# Patient Record
Sex: Male | Born: 1955
Health system: Southern US, Community
[De-identification: ages and names within clinical notes are randomized; demographics above are authoritative.]

## PROBLEM LIST (undated history)

## (undated) DIAGNOSIS — A770 Spotted fever due to Rickettsia rickettsii: Secondary | ICD-10-CM

## (undated) DIAGNOSIS — M51369 Other intervertebral disc degeneration, lumbar region without mention of lumbar back pain or lower extremity pain: Secondary | ICD-10-CM

## (undated) DIAGNOSIS — E7800 Pure hypercholesterolemia, unspecified: Secondary | ICD-10-CM

## (undated) DIAGNOSIS — M5136 Other intervertebral disc degeneration, lumbar region: Secondary | ICD-10-CM

## (undated) DIAGNOSIS — IMO0001 Reserved for inherently not codable concepts without codable children: Secondary | ICD-10-CM

## (undated) DIAGNOSIS — M5030 Other cervical disc degeneration, unspecified cervical region: Secondary | ICD-10-CM

## (undated) DIAGNOSIS — E1122 Type 2 diabetes mellitus with diabetic chronic kidney disease: Secondary | ICD-10-CM

## (undated) HISTORY — PX: COLONOSCOPY: SHX174

## (undated) HISTORY — PX: CATARACT EXTRACTION: SUR2

## (undated) HISTORY — PX: VASECTOMY: SHX75

---
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
## 2001-04-02 ENCOUNTER — Ambulatory Visit (HOSPITAL_COMMUNITY): Admission: RE | Admit: 2001-04-02 | Discharge: 2001-04-02 | Payer: Self-pay | Admitting: Pulmonary Disease

## 2002-10-30 ENCOUNTER — Ambulatory Visit (HOSPITAL_COMMUNITY): Admission: RE | Admit: 2002-10-30 | Discharge: 2002-10-30 | Payer: Self-pay | Admitting: Pulmonary Disease

## 2003-09-15 ENCOUNTER — Ambulatory Visit (HOSPITAL_COMMUNITY): Admission: RE | Admit: 2003-09-15 | Discharge: 2003-09-15 | Payer: Self-pay | Admitting: General Surgery

## 2003-12-26 DIAGNOSIS — IMO0001 Reserved for inherently not codable concepts without codable children: Secondary | ICD-10-CM

## 2003-12-26 HISTORY — DX: Reserved for inherently not codable concepts without codable children: IMO0001

## 2004-04-12 ENCOUNTER — Ambulatory Visit (HOSPITAL_COMMUNITY): Admission: RE | Admit: 2004-04-12 | Discharge: 2004-04-12 | Payer: Self-pay | Admitting: Pulmonary Disease

## 2007-08-06 ENCOUNTER — Ambulatory Visit (HOSPITAL_COMMUNITY): Admission: RE | Admit: 2007-08-06 | Discharge: 2007-08-06 | Payer: Self-pay | Admitting: Pulmonary Disease

## 2011-05-12 NOTE — Procedures (Signed)
NAME:  Andres Robinson, Andres Robinson                          ACCOUNT NO.:  [id number]   MEDICAL RECORD NO.:  [id number]                   PATIENT TYPE:  OUT   LOCATION:  [facility]                                 FACILITY:  [facility]   PHYSICIAN:  Edward L. Juanetta Gosling, M.D.             DATE OF BIRTH:  06/26/1956   DATE OF PROCEDURE:  DATE OF DISCHARGE:                                    STRESS TEST   INDICATIONS FOR PROCEDURE:  [name] has been having chest discomfort.  He  does have high cholesterol and has a family history of cardiac disease as  well.  There are no contraindications to graded exercise testing.   FINDINGS:  [name] exercised for 12 minutes on the Bruce protocol  reaching and sustaining 12.8 mets.  His maximum recorded heart rate was 154  which was 91% of his age-predicted maximal heart rate.  Blood pressure  response to exercise was normal.  He had no symptoms with exercise.  There  were no electrocardiographic changes suggestive of inducible ischemia.   IMPRESSION:  1. No evidence of inducible ischemia.  2. Normal blood pressure response to exercise.  3. Excellent exercise tolerance.  4. No symptoms with exercise.      ___________________________________________                                            Oneal Deputy. Juanetta Gosling, M.D.   ELH/MEDQ  D:  04/12/2004  T:  04/12/2004  Job:  [id number]

## 2011-05-12 NOTE — H&P (Signed)
   NAMEEWEL, LONA NO.:  [id number]   MEDICAL RECORD NO.:  [id number]                  PATIENT TYPE:   LOCATION:                                       FACILITY:  [facility]   PHYSICIAN:  Dalia Heading, M.D.               DATE OF BIRTH:  Nov 20, 1956   DATE OF ADMISSION:  09/15/2003  DATE OF DISCHARGE:                                HISTORY & PHYSICAL   CHIEF COMPLAINT:  Hematochezia.   HISTORY OF PRESENT ILLNESS:  The patient is a 55 year old white male who was  referred for endoscopic evaluation.  He started having hematochezia over the  past 2 months.  Some rectal pain has been noted.  Bright red blood per  rectum has been noted on occasion.  Last episode was earlier today.  His  last colonoscopy in 2000 was negative.  He had limited hematochezia until  recently.  No abdominal pain, nausea, vomiting, diarrhea, constipation, or  melena have been noted.  No history of hemorrhoidal disease.  No family  history of colon carcinoma.   PAST MEDICAL HISTORY:  High cholesterol levels.   PAST SURGICAL HISTORY:  As noted above.   CURRENT MEDICATIONS:  Lipitor.   ALLERGIES:  PENICILLIN.   REVIEW OF SYSTEMS:  Unremarkable.   PHYSICAL EXAMINATION:  GENERAL:  The patient is a well-developed, well-  nourished white male in no acute distress.  VITAL SIGNS:  He is afebrile and vital signs are stable.  RESPIRATORY:  Lungs clear to auscultation with equal breath sounds  bilaterally.  CARDIAC:  Heart examination reveals a regular rate and rhythm without S3,  S4, or murmurs.  ABDOMEN:  The abdomen is soft, nontender, nondistended.  No  hepatosplenomegaly or masses are noted.  RECTAL:  Rectal examination was deferred to the procedure.   IMPRESSION:  Hematochezia.    PLAN:  The patient was scheduled for colonoscopy on September 15, 2003.  The  risks and benefits of the procedure including bleeding and perforation were  fully explained to the patient who gave  informed consent.                                               Dalia Heading, M.D.    MAJ/MEDQ  D:  09/03/2003  T:  09/03/2003  Job:  [id number]   cc:   Ramon Dredge L. Juanetta Gosling, M.D.  [address]  [place]  [place] [address]  Fax: [phone]

## 2011-12-26 DIAGNOSIS — A770 Spotted fever due to Rickettsia rickettsii: Secondary | ICD-10-CM

## 2011-12-26 HISTORY — DX: Spotted fever due to Rickettsia rickettsii: A77.0

## 2013-06-04 ENCOUNTER — Emergency Department (HOSPITAL_COMMUNITY): Payer: BC Managed Care – PPO

## 2013-06-04 ENCOUNTER — Emergency Department (HOSPITAL_COMMUNITY)
Admission: EM | Admit: 2013-06-04 | Discharge: 2013-06-04 | Disposition: A | Discharge status: Home / Self Care | Payer: BC Managed Care – PPO | Attending: Emergency Medicine | Admitting: Emergency Medicine

## 2013-06-04 ENCOUNTER — Encounter (HOSPITAL_COMMUNITY): Payer: Self-pay | Admitting: Emergency Medicine

## 2013-06-04 DIAGNOSIS — J029 Acute pharyngitis, unspecified: Secondary | ICD-10-CM

## 2013-06-04 DIAGNOSIS — T148 Other injury of unspecified body region: Secondary | ICD-10-CM | POA: Insufficient documentation

## 2013-06-04 DIAGNOSIS — W57XXXA Bitten or stung by nonvenomous insect and other nonvenomous arthropods, initial encounter: Secondary | ICD-10-CM | POA: Insufficient documentation

## 2013-06-04 DIAGNOSIS — R52 Pain, unspecified: Secondary | ICD-10-CM | POA: Insufficient documentation

## 2013-06-04 DIAGNOSIS — M51379 Other intervertebral disc degeneration, lumbosacral region without mention of lumbar back pain or lower extremity pain: Secondary | ICD-10-CM | POA: Insufficient documentation

## 2013-06-04 DIAGNOSIS — Z8639 Personal history of other endocrine, nutritional and metabolic disease: Secondary | ICD-10-CM | POA: Insufficient documentation

## 2013-06-04 DIAGNOSIS — Z880 Allergy status to penicillin: Secondary | ICD-10-CM | POA: Insufficient documentation

## 2013-06-04 DIAGNOSIS — R071 Chest pain on breathing: Secondary | ICD-10-CM | POA: Insufficient documentation

## 2013-06-04 DIAGNOSIS — R5383 Other fatigue: Secondary | ICD-10-CM | POA: Insufficient documentation

## 2013-06-04 DIAGNOSIS — R51 Headache: Secondary | ICD-10-CM | POA: Insufficient documentation

## 2013-06-04 DIAGNOSIS — M5030 Other cervical disc degeneration, unspecified cervical region: Secondary | ICD-10-CM | POA: Insufficient documentation

## 2013-06-04 DIAGNOSIS — R0789 Other chest pain: Secondary | ICD-10-CM

## 2013-06-04 DIAGNOSIS — Y929 Unspecified place or not applicable: Secondary | ICD-10-CM | POA: Insufficient documentation

## 2013-06-04 DIAGNOSIS — R5381 Other malaise: Secondary | ICD-10-CM | POA: Insufficient documentation

## 2013-06-04 DIAGNOSIS — Z7982 Long term (current) use of aspirin: Secondary | ICD-10-CM | POA: Insufficient documentation

## 2013-06-04 DIAGNOSIS — Z8619 Personal history of other infectious and parasitic diseases: Secondary | ICD-10-CM | POA: Insufficient documentation

## 2013-06-04 DIAGNOSIS — Z79899 Other long term (current) drug therapy: Secondary | ICD-10-CM | POA: Insufficient documentation

## 2013-06-04 DIAGNOSIS — Z862 Personal history of diseases of the blood and blood-forming organs and certain disorders involving the immune mechanism: Secondary | ICD-10-CM | POA: Insufficient documentation

## 2013-06-04 DIAGNOSIS — M5137 Other intervertebral disc degeneration, lumbosacral region: Secondary | ICD-10-CM | POA: Insufficient documentation

## 2013-06-04 DIAGNOSIS — Y939 Activity, unspecified: Secondary | ICD-10-CM | POA: Insufficient documentation

## 2013-06-04 HISTORY — DX: Reserved for inherently not codable concepts without codable children: IMO0001

## 2013-06-04 HISTORY — DX: Other intervertebral disc degeneration, lumbar region without mention of lumbar back pain or lower extremity pain: M51.369

## 2013-06-04 HISTORY — DX: Pure hypercholesterolemia, unspecified: E78.00

## 2013-06-04 HISTORY — DX: Other intervertebral disc degeneration, lumbar region: M51.36

## 2013-06-04 HISTORY — DX: Other cervical disc degeneration, unspecified cervical region: M50.30

## 2013-06-04 HISTORY — DX: Spotted fever due to Rickettsia rickettsii: A77.0

## 2013-06-04 LAB — CBC WITH DIFFERENTIAL/PLATELET
Eosinophils Absolute: 0.2 10*3/uL (ref 0.0–0.7)
Eosinophils Relative: 2 % (ref 0–5)
Hemoglobin: 14.7 g/dL (ref 13.0–17.0)
Lymphs Abs: 1 10*3/uL (ref 0.7–4.0)
MCH: 31.3 pg (ref 26.0–34.0)
MCV: 89.1 fL (ref 78.0–100.0)
Monocytes Relative: 9 % (ref 3–12)
Neutrophils Relative %: 80 % — ABNORMAL HIGH (ref 43–77)
RBC: 4.69 MIL/uL (ref 4.22–5.81)

## 2013-06-04 LAB — BASIC METABOLIC PANEL
CO2: 27 mEq/L (ref 19–32)
Chloride: 101 mEq/L (ref 96–112)
Glucose, Bld: 128 mg/dL — ABNORMAL HIGH (ref 70–99)
Potassium: 4 mEq/L (ref 3.5–5.1)
Sodium: 140 mEq/L (ref 135–145)

## 2013-06-04 MED ORDER — SODIUM CHLORIDE 0.9 % IV SOLN: INTRAVENOUS | Status: DC

## 2013-06-04 MED ORDER — MORPHINE SULFATE 4 MG/ML IJ SOLN: 4.0000 mg | INTRAMUSCULAR | Status: DC | PRN

## 2013-06-04 MED ORDER — DOXYCYCLINE HYCLATE 100 MG PO TABS: 100.0000 mg | ORAL_TABLET | Freq: Two times a day (BID) | ORAL | Status: DC

## 2013-06-04 MED ORDER — IOHEXOL 300 MG/ML  SOLN
75.0000 mL | Freq: Once | INTRAMUSCULAR | Status: AC | PRN
  Administered 2013-06-04: 75 mL via INTRAVENOUS

## 2013-06-04 NOTE — ED Provider Notes (Signed)
History     CSN: [id number]  Arrival date & time 06/04/13  1702   First MD Initiated Contact with Patient 06/04/13 1935      Chief Complaint  Patient presents with  . Headache  . Chest Pain  . Sore Throat     HPI Pt was seen at 1955.  Per pt, c/o gradual onset and persistence of constant mid-sternal chest "pain" that began approx 1300/1330 PTA.  Pt states the discomfort has been constant since onset; described as "dull" and "tight." Pt also states he has a frontal "dull" headache located in his forehead, sore throat and generalized body aches/fatigue that began this morning.  Pt states his throat hurts "in the back" especially "when I bend over to tie my shoe or touch my throat." Describes the throat pain as "sharp."  Pt states his symptoms "remind me of when I had RMSF last year." Endorses he has pulled multiple ticks off of him over the past 1 to 2 weeks.  Denies neck pain, no fevers, no palpitations, no SOB/cough, no abd pain, no back pain, no N/V/D, no rash.  Denies headache was sudden or maximal in onset or at any time.  Denies visual changes, no focal motor weakness, no tingling/numbness in extremities.       Past Medical History  Diagnosis Date  . High cholesterol   . Normal cardiac stress test 2005  . DDD (degenerative disc disease), lumbar   . RMSF [facility] spotted fever) 2013  . DDD (degenerative disc disease), cervical     History reviewed. No pertinent past surgical history.    History  Substance Use Topics  . Smoking status: Never Smoker   . Smokeless tobacco: Not on file  . Alcohol Use: No      Review of Systems ROS: Statement: All systems negative except as marked or noted in the HPI; Constitutional: Negative for fever and chills. +generalized body aches/fatigue ; ; Eyes: Negative for eye pain, redness and discharge. ; ; ENMT: Negative for ear pain, hoarseness, nasal congestion, sinus pressure and +sore throat. ; ; Cardiovascular: +chest pain.  Negative for palpitations, diaphoresis, dyspnea and peripheral edema. ; ; Respiratory: Negative for cough, wheezing and stridor. ; ; Gastrointestinal: Negative for nausea, vomiting, diarrhea, abdominal pain, blood in stool, hematemesis, jaundice and rectal bleeding. . ; ; Genitourinary: Negative for dysuria, flank pain and hematuria. ; ; Musculoskeletal: Negative for back pain and neck pain. Negative for swelling and trauma.; ; Skin: Negative for pruritus, rash, abrasions, blisters, bruising and skin lesion.; ; Neuro: +headache. Negative for lightheadedness and neck stiffness. Negative for weakness, altered level of consciousness , altered mental status, extremity weakness, paresthesias, involuntary movement, seizure and syncope.       Allergies  Penicillins  Home Medications   Current Outpatient Rx  Name  Route  Sig  Dispense  Refill  . acetaminophen (TYLENOL) 325 MG tablet   Oral   Take 650 mg by mouth every 6 (six) hours as needed for pain.         Marland Kitchen allopurinol (ZYLOPRIM) 300 MG tablet   Oral   Take 450 mg by mouth daily.         Marland Kitchen aspirin EC 81 MG tablet   Oral   Take 162 mg by mouth daily.           BP 138/76  Pulse 74  Temp(Src) 97.9 F (36.6 C)  Resp 20  Ht 5\' 10"  (1.778 m)  Wt 245 lb (111.131  kg)  BMI 35.15 kg/m2  SpO2 100%  Physical Exam 2000: Physical examination:  Nursing notes reviewed; Vital signs and O2 SAT reviewed;  Constitutional: Well developed, Well nourished, Well hydrated, In no acute distress; Head:  Normocephalic, atraumatic; Eyes: EOMI, PERRL, No scleral icterus; ENMT: TM's clear bilat. +edemetous nasal turbinates bilat with clear rhinorrhea. Mouth and pharynx without lesions. No tonsillar exudates. No intra-oral edema. No submandibular or sublingual edema. No hoarse voice, no drooling, no stridor. No pain with manipulation of larynx. Mouth and pharynx normal, Mucous membranes moist; Neck: Supple, Full range of motion, No lymphadenopathy;  Cardiovascular: Regular rate and rhythm, No murmur, rub, or gallop; Respiratory: Breath sounds clear & equal bilaterally, No rales, rhonchi, wheezes.  Speaking full sentences with ease, Normal respiratory effort/excursion; Chest: Nontender, Movement normal; Abdomen: Soft, Nontender, Nondistended, Normal bowel sounds; Genitourinary: No CVA tenderness; Spine:  No midline CS, TS, LS tenderness.; Extremities: Pulses normal, No tenderness, No edema, No calf edema or asymmetry.; Neuro: AA&Ox3, Major CN grossly intact. No facial droop. Speech clear. No gross focal motor or sensory deficits in extremities.; Skin: Color normal, Warm, Dry.   ED Course  Procedures     MDM  MDM Reviewed: previous chart, nursing note and vitals Reviewed previous: labs Interpretation: ECG, labs, x-ray and CT scan    Date: 06/04/2013  Rate: 75  Rhythm: normal sinus rhythm  QRS Axis: left  Intervals: normal  ST/T Wave abnormalities: normal  Conduction Disutrbances:none  Narrative Interpretation:   Old EKG Reviewed: none available   Results for orders placed during the hospital encounter of 06/04/13  RAPID STREP SCREEN      Result Value Range   Streptococcus, Group A Screen (Direct) NEGATIVE  NEGATIVE  BASIC METABOLIC PANEL      Result Value Range   Sodium 140  135 - 145 mEq/L   Potassium 4.0  3.5 - 5.1 mEq/L   Chloride 101  96 - 112 mEq/L   CO2 27  19 - 32 mEq/L   Glucose, Bld 128 (*) 70 - 99 mg/dL   BUN 18  6 - 23 mg/dL   Creatinine, Ser 1.61  0.50 - 1.35 mg/dL   Calcium 9.6  8.4 - 09.6 mg/dL   GFR calc non Af Amer 75 (*) >90 mL/min   GFR calc Af Amer 87 (*) >90 mL/min  CBC WITH DIFFERENTIAL      Result Value Range   WBC 11.1 (*) 4.0 - 10.5 K/uL   RBC 4.69  4.22 - 5.81 MIL/uL   Hemoglobin 14.7  13.0 - 17.0 g/dL   HCT 04.5  40.9 - 81.1 %   MCV 89.1  78.0 - 100.0 fL   MCH 31.3  26.0 - 34.0 pg   MCHC 35.2  30.0 - 36.0 g/dL   RDW 91.4  78.2 - 95.6 %   Platelets 186  150 - 400 K/uL   Neutrophils  Relative % 80 (*) 43 - 77 %   Neutro Abs 8.9 (*) 1.7 - 7.7 K/uL   Lymphocytes Relative 9 (*) 12 - 46 %   Lymphs Abs 1.0  0.7 - 4.0 K/uL   Monocytes Relative 9  3 - 12 %   Monocytes Absolute 1.0  0.1 - 1.0 K/uL   Eosinophils Relative 2  0 - 5 %   Eosinophils Absolute 0.2  0.0 - 0.7 K/uL   Basophils Relative 1  0 - 1 %   Basophils Absolute 0.1  0.0 - 0.1 K/uL  TROPONIN I  Result Value Range   Troponin I <0.30  <0.30 ng/mL   Dg Chest 2 View 06/04/2013   *RADIOLOGY REPORT*  Clinical Data:  Sore throat and pleuritic chest pain.  CHEST - 2 VIEW  Comparison: 08/06/2007  Findings: The heart size and mediastinal contours are within normal limits.  Both lungs are clear.  The visualized skeletal structures are unremarkable.  IMPRESSION: No active disease.   Original Report Authenticated By: Irish Lack, M.D.   Ct Head Wo Contrast 06/04/2013   *RADIOLOGY REPORT*  Clinical Data: Headache.  The headache is worse when the patient bends over to the tie his shoes.  CT HEAD WITHOUT CONTRAST  Technique:  Contiguous axial images were obtained from the base of the skull through the vertex without contrast.  Comparison: None.  Findings: No acute intracranial abnormality is present. Specifically, there is no evidence for acute infarct, hemorrhage, mass, hydrocephalus, or extra-axial fluid collection.  The paranasal sinuses and mastoid air cells are clear.  The globes and orbits are intact.  The osseous skull is intact.  IMPRESSION: Negative CT of the head.   Original Report Authenticated By: Marin Roberts, M.D.   Ct Soft Tissue Neck W Contrast 06/04/2013   *RADIOLOGY REPORT*  Clinical Data: Sore throat.  CT NECK WITH CONTRAST  Technique:  Multidetector CT imaging of the neck was performed with intravenous contrast.  Contrast: 75mL OMNIPAQUE IOHEXOL 300 MG/ML  SOLN  Comparison: None.  Findings: Limited imaging of the brain is unremarkable.  The palatine tonsils are enlarged bilaterally.  Punctate  calcifications are noted as well.  No discrete abscess is evident.  The parapharyngeal fat is clean.  No significant retropharyngeal adenopathy is present.  There is no prevertebral edema.  The vocal cords are midline and symmetric.  No significant adenopathy is present.  The thyroid is within normal limits.  Upper mediastinum is unremarkable.  Endplate degenerative changes are most evident at C4-5 and C5-6 the lung apices are clear.  IMPRESSION:  1.  Enlarged bilateral palatine tonsils.  Punctate calcifications are present as well.  This may represent acute on chronic tonsillitis. 2.  No evidence for abscess or phlegmon. 3.  Moderate degenerative changes within the mid cervical spine.   Original Report Authenticated By: Marin Roberts, M.D.    2215:  Doubt PE as cause for symptoms with low risk Wells.  Doubt ACS as cause for symptoms with normal troponin and normal EKG after 7+ hours of constant symptoms, as well as having hx of negative cardiac stress test in 2005.  Doubt aortic/great vessel dissection given multiple random symptoms, no widened mediastinum on CXR and unremarkable upper mediastinum on CT neck. Doubt SAH given normal neuro exam, normal CT scan and frontal location of "dull" headache. VS remain stable, continues to appear NAD, resps easy, neuro exam unchanged. Feels improved after pain meds and wants to go home now. Pt again voices concerns regarding RMSF and that his symptoms today are similar to his previous symptoms last year. Pt also does endorse exposure to multiple ticks in the past 1 to 2 weeks. Will tx with doxycycline. Pt wants to go home now. Dx and testing d/w pt and family.  Questions answered.  Verb understanding, agreeable to d/c home with outpt f/u.         Laray Anger, DO 06/07/13 0030

## 2013-06-04 NOTE — ED Notes (Signed)
States he feels like he did when he had RMSF last year, history of tick bites.

## 2013-06-04 NOTE — ED Notes (Signed)
Pt c/o ha and chest tightness and sore throat since 1330. Pt states he had a sharp pain in throat when bending over to tie shoe.

## 2013-06-07 LAB — CULTURE, GROUP A STREP

## 2013-11-03 ENCOUNTER — Other Ambulatory Visit (HOSPITAL_COMMUNITY): Payer: Self-pay | Admitting: Pulmonary Disease

## 2013-11-03 DIAGNOSIS — R109 Unspecified abdominal pain: Secondary | ICD-10-CM

## 2013-11-06 ENCOUNTER — Other Ambulatory Visit (HOSPITAL_COMMUNITY): Payer: Self-pay | Admitting: Pulmonary Disease

## 2013-11-06 ENCOUNTER — Ambulatory Visit (HOSPITAL_COMMUNITY)
Admission: RE | Admit: 2013-11-06 | Discharge: 2013-11-06 | Disposition: A | Discharge status: Home / Self Care | Payer: BC Managed Care – PPO | Source: Ambulatory Visit | Attending: Pulmonary Disease | Admitting: Pulmonary Disease

## 2013-11-06 DIAGNOSIS — R079 Chest pain, unspecified: Secondary | ICD-10-CM

## 2013-11-06 DIAGNOSIS — R109 Unspecified abdominal pain: Secondary | ICD-10-CM | POA: Insufficient documentation

## 2013-11-06 DIAGNOSIS — M549 Dorsalgia, unspecified: Secondary | ICD-10-CM | POA: Insufficient documentation

## 2013-12-01 ENCOUNTER — Encounter: Payer: Self-pay | Admitting: Vascular Surgery

## 2013-12-02 ENCOUNTER — Encounter (INDEPENDENT_AMBULATORY_CARE_PROVIDER_SITE_OTHER): Payer: Self-pay | Admitting: *Deleted

## 2013-12-29 ENCOUNTER — Encounter: Payer: Self-pay | Admitting: Vascular Surgery

## 2013-12-30 ENCOUNTER — Encounter: Payer: BC Managed Care – PPO | Admitting: Vascular Surgery

## 2014-01-01 ENCOUNTER — Ambulatory Visit (INDEPENDENT_AMBULATORY_CARE_PROVIDER_SITE_OTHER): Payer: BC Managed Care – PPO | Admitting: Internal Medicine

## 2014-01-01 ENCOUNTER — Encounter (INDEPENDENT_AMBULATORY_CARE_PROVIDER_SITE_OTHER): Payer: Self-pay | Admitting: Internal Medicine

## 2014-01-01 ENCOUNTER — Other Ambulatory Visit (INDEPENDENT_AMBULATORY_CARE_PROVIDER_SITE_OTHER): Payer: Self-pay | Admitting: *Deleted

## 2014-01-01 ENCOUNTER — Encounter (INDEPENDENT_AMBULATORY_CARE_PROVIDER_SITE_OTHER): Payer: Self-pay | Admitting: *Deleted

## 2014-01-01 VITALS — BP 104/70 | HR 72 | Temp 97.9°F | Ht 71.0 in | Wt 225.8 lb

## 2014-01-01 DIAGNOSIS — K219 Gastro-esophageal reflux disease without esophagitis: Secondary | ICD-10-CM

## 2014-01-01 NOTE — Progress Notes (Signed)
Subjective:     Patient ID: Andres Robinson, male   DOB: Apr 11, 1956, 58 y.o.   MRN: [id number]  HPI 58 yr old male referred to our office by Dr. Luan Pulling for possible PUD. He was having epigastric pain and radiated into his back. The abdominal pain lasted for a couple of days. Stopped after starting Protonix.  He tells me popcorn really set the pain off. Onions kinda bothered him.  He tells me he started having shooting pain in his flank. He was having back pain for about a month. He tells me once he changed his diet, his symptoms became better. He stopped sweets, bread. Appetite is good. He has about about 15 pounds since September due to dietary changes. BMs are normal. Usually has a BM about 1-2 a day. No melena or bright red rectal bleeding,. Last colonoscopy within the last 10 yrs and was normal. (Dr. Arnoldo Morale).   10/2013 HA1C 5.8 10/2013 US abdomen: abdominal pain: CBD 70mm  No causes for abdominal pain identified. Proximal aorta measures 3 cm however tapers distally to 2.1 and 1.8cm respectively. Review of Systems Past Medical History  Diagnosis Date  . High cholesterol   . Normal cardiac stress test 2005  . DDD (degenerative disc disease), lumbar   . RMSF [facility] spotted fever) 2013  . DDD (degenerative disc disease), cervical    Current Outpatient Prescriptions on File Prior to Visit  Medication Sig Dispense Refill  . acetaminophen (TYLENOL) 325 MG tablet Take 650 mg by mouth every 6 (six) hours as needed for pain.      Marland Kitchen allopurinol (ZYLOPRIM) 300 MG tablet Take 450 mg by mouth daily.      . pantoprazole (PROTONIX) 40 MG tablet Take 40 mg by mouth daily.      . pravastatin (PRAVACHOL) 80 MG tablet Take 80 mg by mouth daily.      Marland Kitchen aspirin EC 81 MG tablet Take 162 mg by mouth daily.       No current facility-administered medications on file prior to visit.   Allergies  Allergen Reactions  . Penicillins Other (See Comments)    Childhood Reaction    History  reviewed. No pertinent past surgical history.     Objective:   Physical Exam  Filed Vitals:   01/01/14 1050  BP: 104/70  Pulse: 72  Temp: 97.9 F (36.6 C)  Height: 5\' 11"  (1.803 m)  Weight: 225 lb 12.8 oz (102.422 kg)   The risks and benefits such as perforation, bleeding, and infection were reviewed with the patient and is agreeable.     Assessment:    GERD controlled at this time. PUD needs to be ruled out.     Plan:    EGD with Dr. Laural Golden.

## 2014-01-01 NOTE — Patient Instructions (Signed)
EGD/ED with Dr. Rehman. 

## 2014-01-05 ENCOUNTER — Encounter: Payer: Self-pay | Admitting: Vascular Surgery

## 2014-01-06 ENCOUNTER — Encounter (HOSPITAL_COMMUNITY): Payer: Self-pay

## 2014-01-06 ENCOUNTER — Encounter: Payer: BC Managed Care – PPO | Admitting: Vascular Surgery

## 2014-01-14 ENCOUNTER — Ambulatory Visit (HOSPITAL_COMMUNITY)
Admission: RE | Admit: 2014-01-14 | Discharge: 2014-01-14 | Disposition: A | Discharge status: Home / Self Care | Payer: BC Managed Care – PPO | Source: Ambulatory Visit | Attending: Internal Medicine | Admitting: Internal Medicine

## 2014-01-14 ENCOUNTER — Encounter (HOSPITAL_COMMUNITY)
Admission: RE | Disposition: A | Discharge status: Home / Self Care | Payer: Self-pay | Source: Ambulatory Visit | Attending: Internal Medicine

## 2014-01-14 ENCOUNTER — Encounter (HOSPITAL_COMMUNITY): Payer: Self-pay | Admitting: *Deleted

## 2014-01-14 DIAGNOSIS — K2970 Gastritis, unspecified, without bleeding: Secondary | ICD-10-CM

## 2014-01-14 DIAGNOSIS — E7800 Pure hypercholesterolemia, unspecified: Secondary | ICD-10-CM | POA: Insufficient documentation

## 2014-01-14 DIAGNOSIS — K2990 Gastroduodenitis, unspecified, without bleeding: Principal | ICD-10-CM

## 2014-01-14 DIAGNOSIS — D131 Benign neoplasm of stomach: Secondary | ICD-10-CM | POA: Insufficient documentation

## 2014-01-14 DIAGNOSIS — K449 Diaphragmatic hernia without obstruction or gangrene: Secondary | ICD-10-CM

## 2014-01-14 DIAGNOSIS — K219 Gastro-esophageal reflux disease without esophagitis: Secondary | ICD-10-CM

## 2014-01-14 DIAGNOSIS — R1013 Epigastric pain: Secondary | ICD-10-CM | POA: Insufficient documentation

## 2014-01-14 HISTORY — PX: ESOPHAGOGASTRODUODENOSCOPY: SHX5428

## 2014-01-14 SURGERY — EGD (ESOPHAGOGASTRODUODENOSCOPY)
Anesthesia: Moderate Sedation

## 2014-01-14 MED ORDER — MEPERIDINE HCL 50 MG/ML IJ SOLN
INTRAMUSCULAR | Status: AC
  Filled 2014-01-14: qty 1

## 2014-01-14 MED ORDER — MIDAZOLAM HCL 5 MG/5ML IJ SOLN
INTRAMUSCULAR | Status: AC
  Filled 2014-01-14: qty 10

## 2014-01-14 MED ORDER — SODIUM CHLORIDE 0.9 % IV SOLN
INTRAVENOUS | Status: DC
  Administered 2014-01-14: 1000 mL via INTRAVENOUS

## 2014-01-14 MED ORDER — BUTAMBEN-TETRACAINE-BENZOCAINE 2-2-14 % EX AERO
INHALATION_SPRAY | CUTANEOUS | Status: DC | PRN
  Administered 2014-01-14: 2 via TOPICAL

## 2014-01-14 MED ORDER — MEPERIDINE HCL 50 MG/ML IJ SOLN
INTRAMUSCULAR | Status: DC | PRN
  Administered 2014-01-14 (×2): 25 mg via INTRAVENOUS

## 2014-01-14 MED ORDER — STERILE WATER FOR IRRIGATION IR SOLN
Status: DC | PRN
  Administered 2014-01-14: 12:00:00

## 2014-01-14 MED ORDER — MIDAZOLAM HCL 5 MG/5ML IJ SOLN
INTRAMUSCULAR | Status: DC | PRN
  Administered 2014-01-14: 1 mg via INTRAVENOUS
  Administered 2014-01-14 (×2): 2 mg via INTRAVENOUS

## 2014-01-14 NOTE — Progress Notes (Signed)
H Pylori drawn and sent to lab.

## 2014-01-14 NOTE — Op Note (Addendum)
EGD PROCEDURE REPORT  PATIENT:  Andres Robinson  MR#:  [id number] Birthdate:  16-Aug-1956, 58 y.o., male Endoscopist:  Dr. Rogene Houston, MD Referred By:  Dr. Jasper Loser. Luan Pulling, MD  Procedure Date: 01/14/2014  Procedure:   EGD  Indications:  Patient is 58 year old Caucasian male with history of epigastric pain radiating into the intrascapular region. Ultrasound was negative for cholelithiasis. It showed focal ectasia to aorta and is scheduled to be seen by vascular surgeon. He is feeling better with pantoprazole. He is undergoing diagnostic EGD.            Informed Consent:  The risks, benefits, alternatives & imponderables which include, but are not limited to, bleeding, infection, perforation, drug reaction and potential missed lesion have been reviewed.  The potential for biopsy, lesion removal, esophageal dilation, etc. have also been discussed.  Questions have been answered.  All parties agreeable.  Please see history & physical in medical record for more information.  Medications:  Demerol 50 mg IV Versed 5 mg IV Cetacaine spray topically for oropharyngeal anesthesia  Description of procedure:  The endoscope was introduced through the mouth and advanced to the second portion of the duodenum without difficulty or limitations. The mucosal surfaces were surveyed very carefully during advancement of the scope and upon withdrawal.  Findings:  Esophagus:  Mucosa of the esophagus was normal. GE junction was unremarkable. GEJ:  38 cm Hiatus:  40 cm Stomach:  Stomach was empty and distended very well with insufflation. Folds in the proximal stomach were normal. Examination of mucosa at body was normal. Focal erythema noted at antrum. Pyloric channel was patent. Angularis fundus and cardia were examined by retroflexing the scope and were normal with exception of two small hyperplastic-appearing polyps close to cardia. Duodenum:  Patchy bulbar edema and erythema noted. Post bulbar mucosa was  normal. Ampulla of Vater was well seen and was normal.  Therapeutic/Diagnostic Maneuvers Performed:  None   Complications:  None  Impression: Small sliding hiatal hernia. Nonerosive gastroduodenitis. Two small hyperplastic polyps at gastric fundus which were left alone.  Comment; These findings would would not explain patient's symptoms. He could have had PUD which has healed with therapy.  Recommendations:  Continue pantoprazole for now. H. pylori serology.  Eleny Cortez U  01/14/2014  12:08 PM  CC: Dr. Alonza Bogus, MD & Dr. Rayne Du ref. provider found

## 2014-01-14 NOTE — Discharge Instructions (Signed)
Resume usual medications and diet. No driving for 24 hours. Physician will contact you with results of blood test(H. Pylori serology). Esophagogastroduodenoscopy Care After Refer to this sheet in the next few weeks. These instructions provide you with information on caring for yourself after your procedure. Your caregiver may also give you more specific instructions. Your treatment has been planned according to current medical practices, but problems sometimes occur. Call your caregiver if you have any problems or questions after your procedure.  HOME CARE INSTRUCTIONS  Do not eat or drink anything until the numbing medicine (local anesthetic) has worn off and your gag reflex has returned. You will know that the local anesthetic has worn off when you can swallow comfortably.  Do not drive for 12 hours after the procedure or as directed by your caregiver.  Only take medicines as directed by your caregiver. SEEK MEDICAL CARE IF:   You cannot stop coughing.  You are not urinating at all or less than usual. SEEK IMMEDIATE MEDICAL CARE IF:  You have difficulty swallowing.  You cannot eat or drink.  You have worsening throat or chest pain.  You have dizziness, lightheadedness, or you faint.  You have nausea or vomiting.  You have chills.  You have a fever.  You have severe abdominal pain.  You have black, tarry, or bloody stools. Document Released: 11/27/2012 Document Reviewed: 11/27/2012 [facility] Patient Information 2014 [place], [place].

## 2014-01-14 NOTE — H&P (Signed)
Andres Robinson is an 58 y.o. male.   Chief Complaint: Patient is here for EGD. HPI: Patient is 58 year old Caucasian male who presents with intermittent epigastric pain radiating to the intrascapular region. He was seen by Dr. Luan Pulling in the ultrasound which was negative for cholelithiasis. It did show focal dilation to. He has an appointment to see Industrial/product designer. He is feeling better since he was begun on pantoprazole. He denies nausea vomiting or heartburn. He used to have sporadic  Heartburn; since he has been on a diet and lost 20 pounds he doesn't have heartburn anymore. No history of melena or rectal bleeding. Blood work reportedly was normal.  Past Medical History  Diagnosis Date  . High cholesterol   . Normal cardiac stress test 2005  . DDD (degenerative disc disease), lumbar   . RMSF [facility] spotted fever) 2013  . DDD (degenerative disc disease), cervical     Past Surgical History  Procedure Laterality Date  . Colonoscopy      Family History  Problem Relation Age of Onset  . Heart disease Father   . Diabetes Father    Social History:  reports that he has quit smoking. He does not have any smokeless tobacco history on file. He reports that he drinks alcohol. He reports that he does not use illicit drugs.  Allergies:  Allergies  Allergen Reactions  . Penicillins Other (See Comments)    Childhood Reaction     Medications Prior to Admission  Medication Sig Dispense Refill  . allopurinol (ZYLOPRIM) 300 MG tablet Take 300 mg by mouth daily.       . Naphazoline-Glycerin (CLEAR EYES REDNESS RELIEF OP) Apply 2 drops to eye daily.      . pantoprazole (PROTONIX) 40 MG tablet Take 40 mg by mouth daily.      . pravastatin (PRAVACHOL) 80 MG tablet Take 80 mg by mouth daily.        No results found for this or any previous visit (from the past 48 hour(s)). No results found.  ROS  Blood pressure 135/91, pulse 57, temperature 97.5 F (36.4 C), temperature source Oral,  resp. rate 23, height 5\' 11"  (1.803 m), weight 225 lb (102.059 kg), SpO2 97.00%. Physical Exam  Constitutional: He appears well-developed and well-nourished.  HENT:  Mouth/Throat: Oropharynx is clear and moist.  Eyes: Conjunctivae are normal. No scleral icterus.  Neck: No thyromegaly present.  Cardiovascular: Normal rate, regular rhythm and normal heart sounds.   No murmur heard. Respiratory: Effort normal and breath sounds normal.  GI: Soft. He exhibits no distension and no mass. There is no tenderness.  Musculoskeletal: He exhibits no edema.  Lymphadenopathy:    He has no cervical adenopathy.  Neurological: He is alert.  Skin: Skin is warm and dry.     Assessment/Plan History of epigastric pain. Diagnostic EGD.  Malia Corsi U 01/14/2014, 11:48 AM

## 2014-01-15 LAB — H. PYLORI ANTIBODY, IGG: H PYLORI IGG: 0.89 {ISR}

## 2014-01-19 ENCOUNTER — Encounter: Payer: Self-pay | Admitting: Vascular Surgery

## 2014-01-19 ENCOUNTER — Encounter (HOSPITAL_COMMUNITY): Payer: Self-pay | Admitting: Internal Medicine

## 2014-01-20 ENCOUNTER — Ambulatory Visit (INDEPENDENT_AMBULATORY_CARE_PROVIDER_SITE_OTHER): Payer: BC Managed Care – PPO | Admitting: Vascular Surgery

## 2014-01-20 ENCOUNTER — Encounter: Payer: Self-pay | Admitting: Vascular Surgery

## 2014-01-20 VITALS — BP 132/93 | HR 63 | Resp 16 | Ht 71.0 in | Wt 224.0 lb

## 2014-01-20 DIAGNOSIS — I7789 Other specified disorders of arteries and arterioles: Secondary | ICD-10-CM | POA: Insufficient documentation

## 2014-01-20 NOTE — Addendum Note (Signed)
Addended by: Dorthula Rue L on: 01/20/2014 03:04 PM   Modules accepted: Orders

## 2014-01-20 NOTE — Progress Notes (Signed)
Subjective:     Patient ID: Andres Robinson, male   DOB: January 29, 1956, 58 y.o.   MRN: [id number]  HPI this 58 year old male was referred by Dr. Luan Pulling for evaluation of small abdominal aortic aneurysm. This was recently discovered on ultrasound study performed because of abdominal discomfort which has subsequently resolved on protonix. Patient denies any current abdominal or back symptoms, nausea vomiting, or change in bowel habits. He had an upper endoscopy recently which was normal by Dr.Rehman..  Past Medical History  Diagnosis Date  . High cholesterol   . Normal cardiac stress test 2005  . DDD (degenerative disc disease), lumbar   . RMSF [facility] spotted fever) 2013  . DDD (degenerative disc disease), cervical     History  Substance Use Topics  . Smoking status: Former Research scientist (life sciences)  . Smokeless tobacco: Not on file     Comment: quit in 1982  . Alcohol Use: Yes     Comment: 2-3 glasses wine a week    Family History  Problem Relation Age of Onset  . Heart disease Father   . Diabetes Father     Allergies  Allergen Reactions  . Penicillins Other (See Comments)    Childhood Reaction     Current outpatient prescriptions:allopurinol (ZYLOPRIM) 300 MG tablet, Take 300 mg by mouth daily. , Disp: , Rfl: ;  Naphazoline-Glycerin (CLEAR EYES REDNESS RELIEF OP), Apply 2 drops to eye daily., Disp: , Rfl: ;  pantoprazole (PROTONIX) 40 MG tablet, Take 40 mg by mouth daily., Disp: , Rfl: ;  pravastatin (PRAVACHOL) 80 MG tablet, Take 80 mg by mouth daily., Disp: , Rfl:   BP 132/93  Pulse 63  Resp 16  Ht 5\' 11"  (1.803 m)  Wt 224 lb (101.606 kg)  BMI 31.26 kg/m2  Body mass index is 31.26 kg/(m^2).           Review of Systems denies chest pain, dyspnea on exertion, PND, orthopnea, hemoptysis, claudication, lateralizing weakness, aphasia, amaurosis fugax complicated, blurred vision, syncope. All systems negative complete review of systems    Objective:   Physical Exam BP 132/93   Pulse 63  Resp 16  Ht 5\' 11"  (1.803 m)  Wt 224 lb (101.606 kg)  BMI 31.26 kg/m2  Gen.-alert and oriented x3 in no apparent distress HEENT normal for age Lungs no rhonchi or wheezing Cardiovascular regular rhythm no murmurs carotid pulses 3+ palpable no bruits audible Abdomen soft nontender no palpable masses Musculoskeletal free of  major deformities Skin clear -no rashes Neurologic normal Lower extremities 3+ femoral and dorsalis pedis pulses palpable bilaterally with no edema  Today I reviewed the ultrasound report which was performed at [facility] on November 13. This revealed dilatation of the infrarenal aorta up to a maximum diameter of 3.0 cm.        Assessment:     3.0 cm dilatation of infra renal aorta-asymptomatic    Plan:     Return in one year with duplex ultrasound to follow size of this very small aortic aneurysm Discuss this problem at length with patient and his wife and the fact that this will likely never required treatment but does need to be followed Patient discontinued smoking in 1982 but does have a family history of aortic aneurysm in his father  Will return in one year

## 2014-06-14 IMAGING — CT CT HEAD W/O CM
1 series · 16 of 30 positions shown, 20 images · non-contrast
Comparison: None.

CLINICAL DATA: Headache.  The headache is worse when the patient
bends over to the tie his shoes.

CT HEAD WITHOUT CONTRAST
TECHNIQUE: Contiguous axial images were obtained from the base of
the skull through the vertex without contrast.

[Series 3: headseq 4.8 h37s · axial · 0.49mm/px · z∈[+289,+431]mm · 16 of 30 slices shown, 20 images]
[im 2/30  brain]
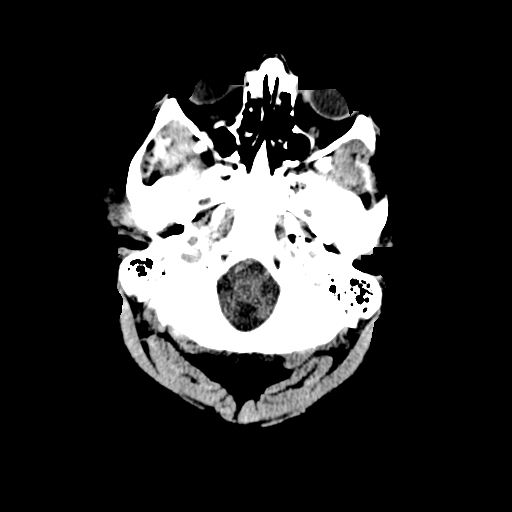
[im 2/30  bone]
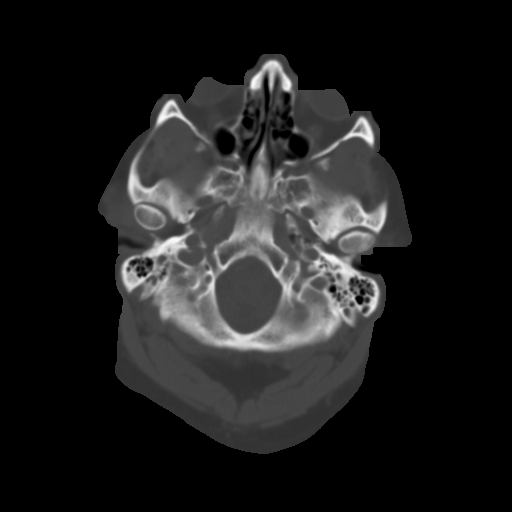
[im 4/30  brain]
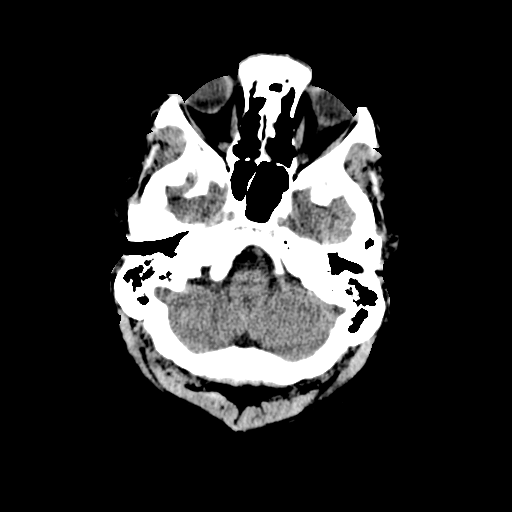
[im 6/30  brain]
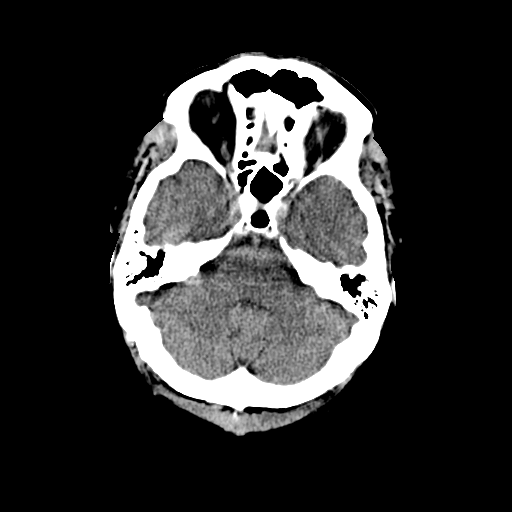
[im 8/30  brain]
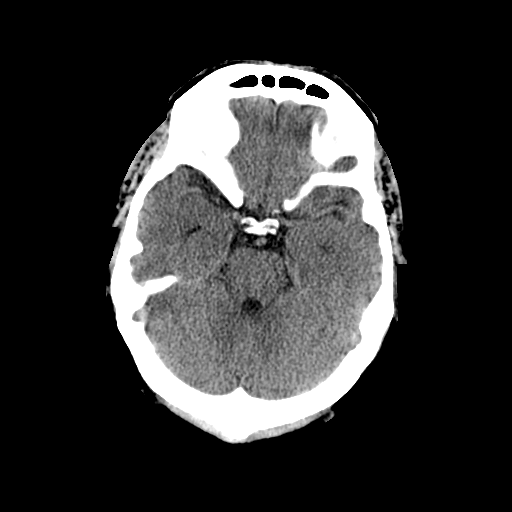
[im 9/30  brain]
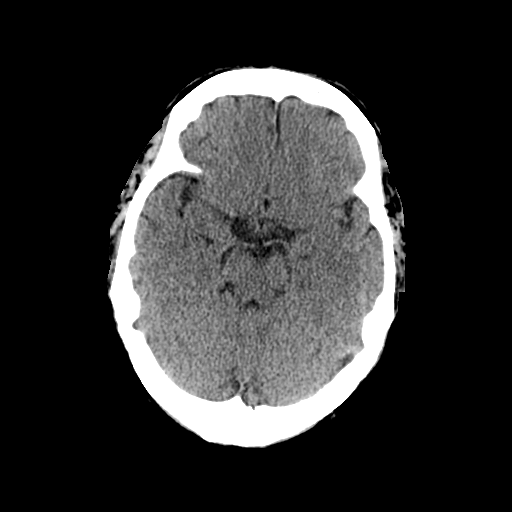
[im 9/30  bone]
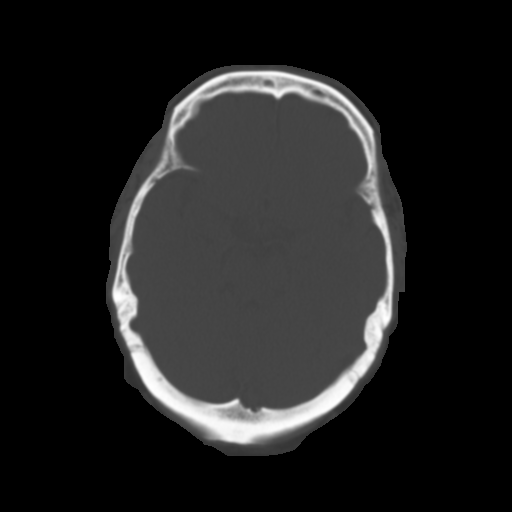
[im 11/30  brain]
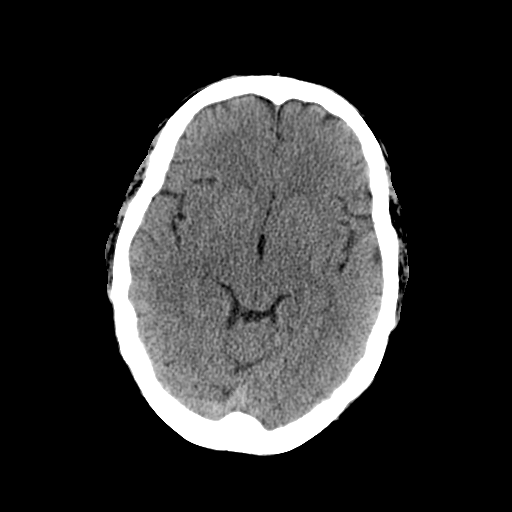
[im 13/30  brain]
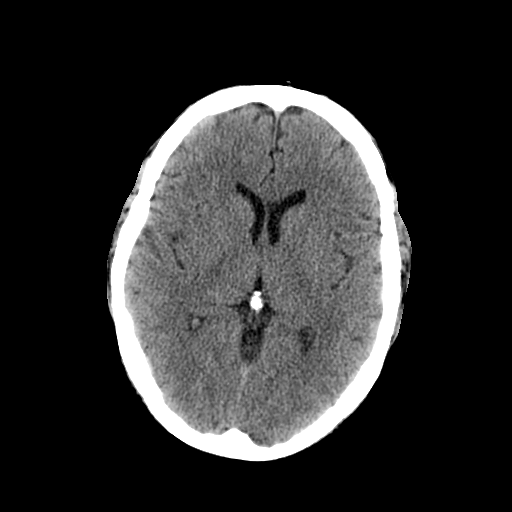
[im 15/30  brain]
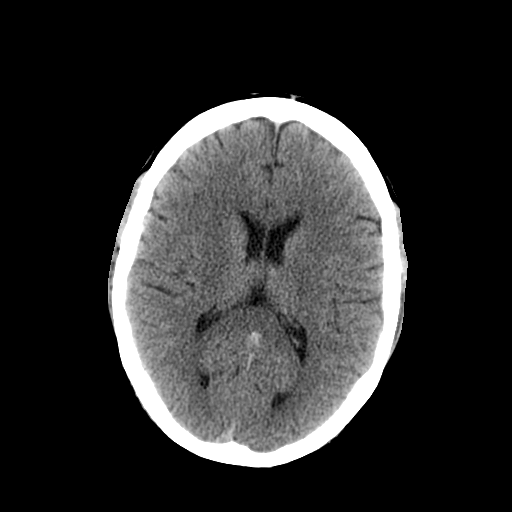
[im 16/30  brain]
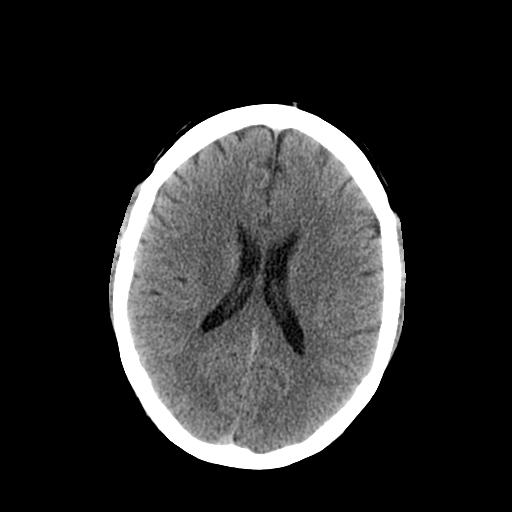
[im 16/30  bone]
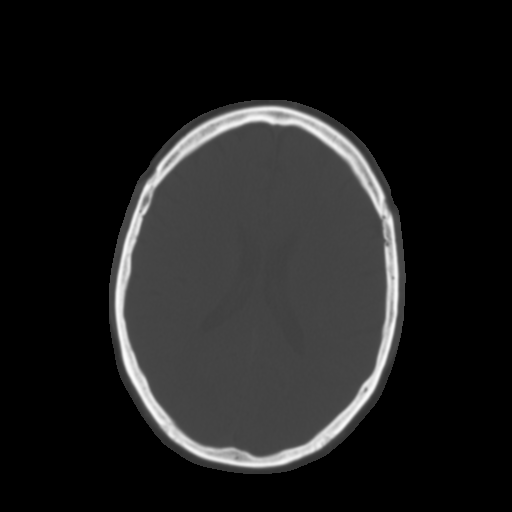
[im 18/30  brain]
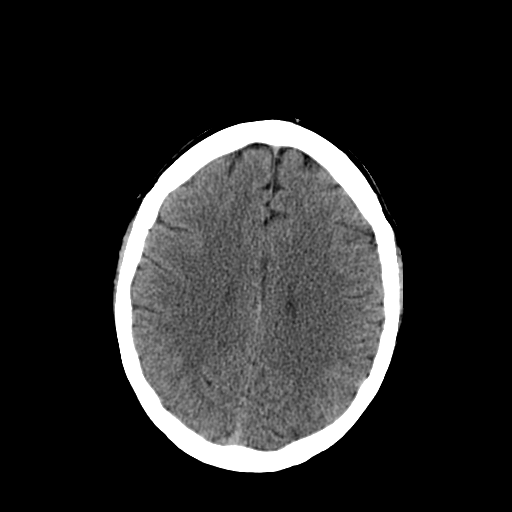
[im 20/30  brain]
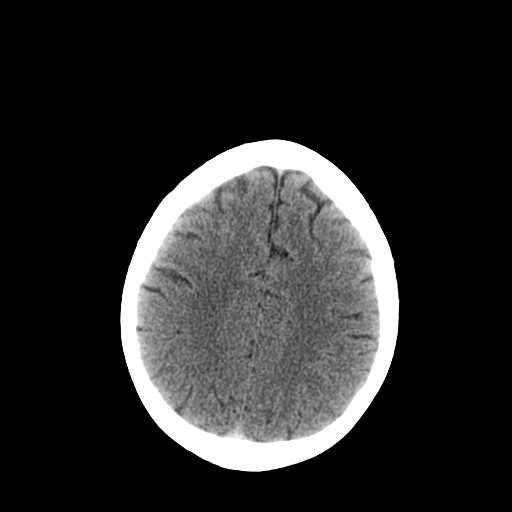
[im 22/30  brain]
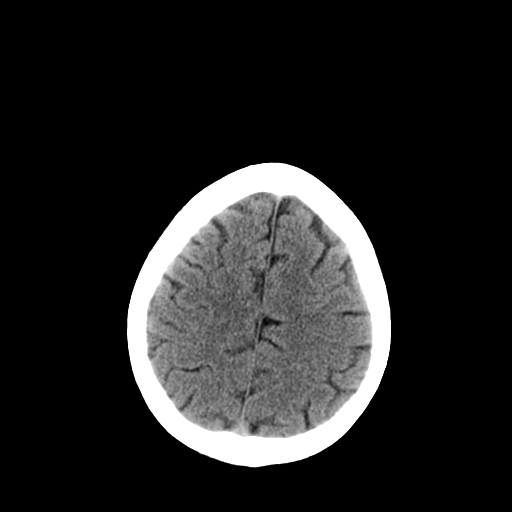
[im 23/30  brain]
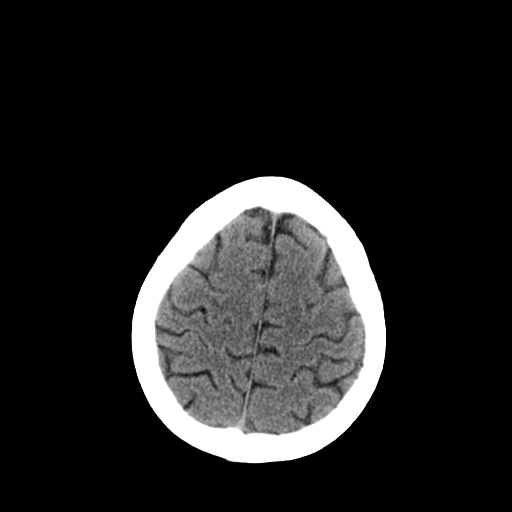
[im 23/30  bone]
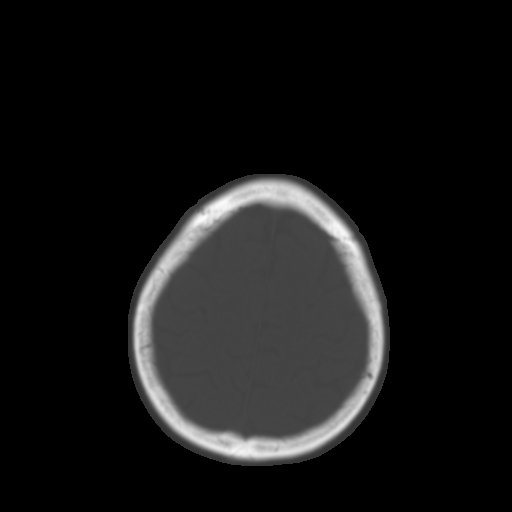
[im 25/30  brain]
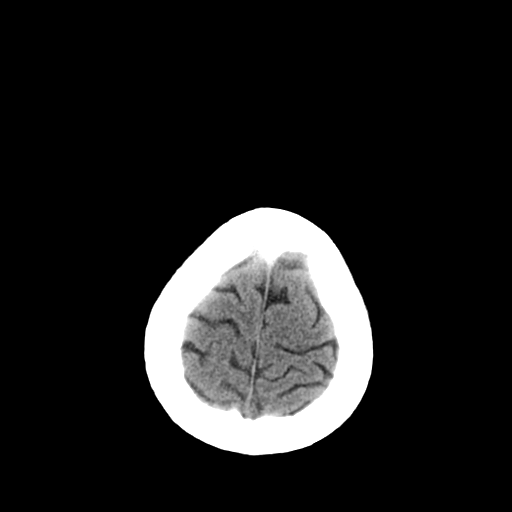
[im 27/30  brain]
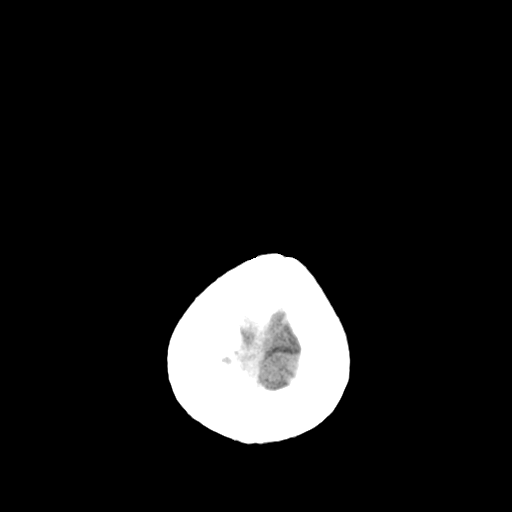
[im 29/30  brain]
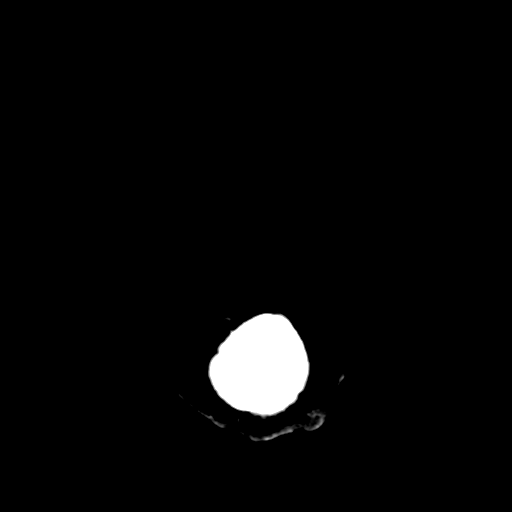

[16 of 30 positions shown; findings below may reference images not displayed]

FINDINGS: No acute intracranial abnormality is present.
Specifically, there is no evidence for acute infarct, hemorrhage,
mass, hydrocephalus, or extra-axial fluid collection.  The
paranasal sinuses and mastoid air cells are clear.  The globes and
orbits are intact.  The osseous skull is intact.
IMPRESSION: Negative CT of the head.

## 2014-06-14 IMAGING — CR DG CHEST 2V
2 series · 2 of 2 positions shown · non-contrast
Comparison: 08/06/2007

CLINICAL DATA: Sore throat and pleuritic chest pain.

CHEST - 2 VIEW

[view not recorded (1 of 2)]
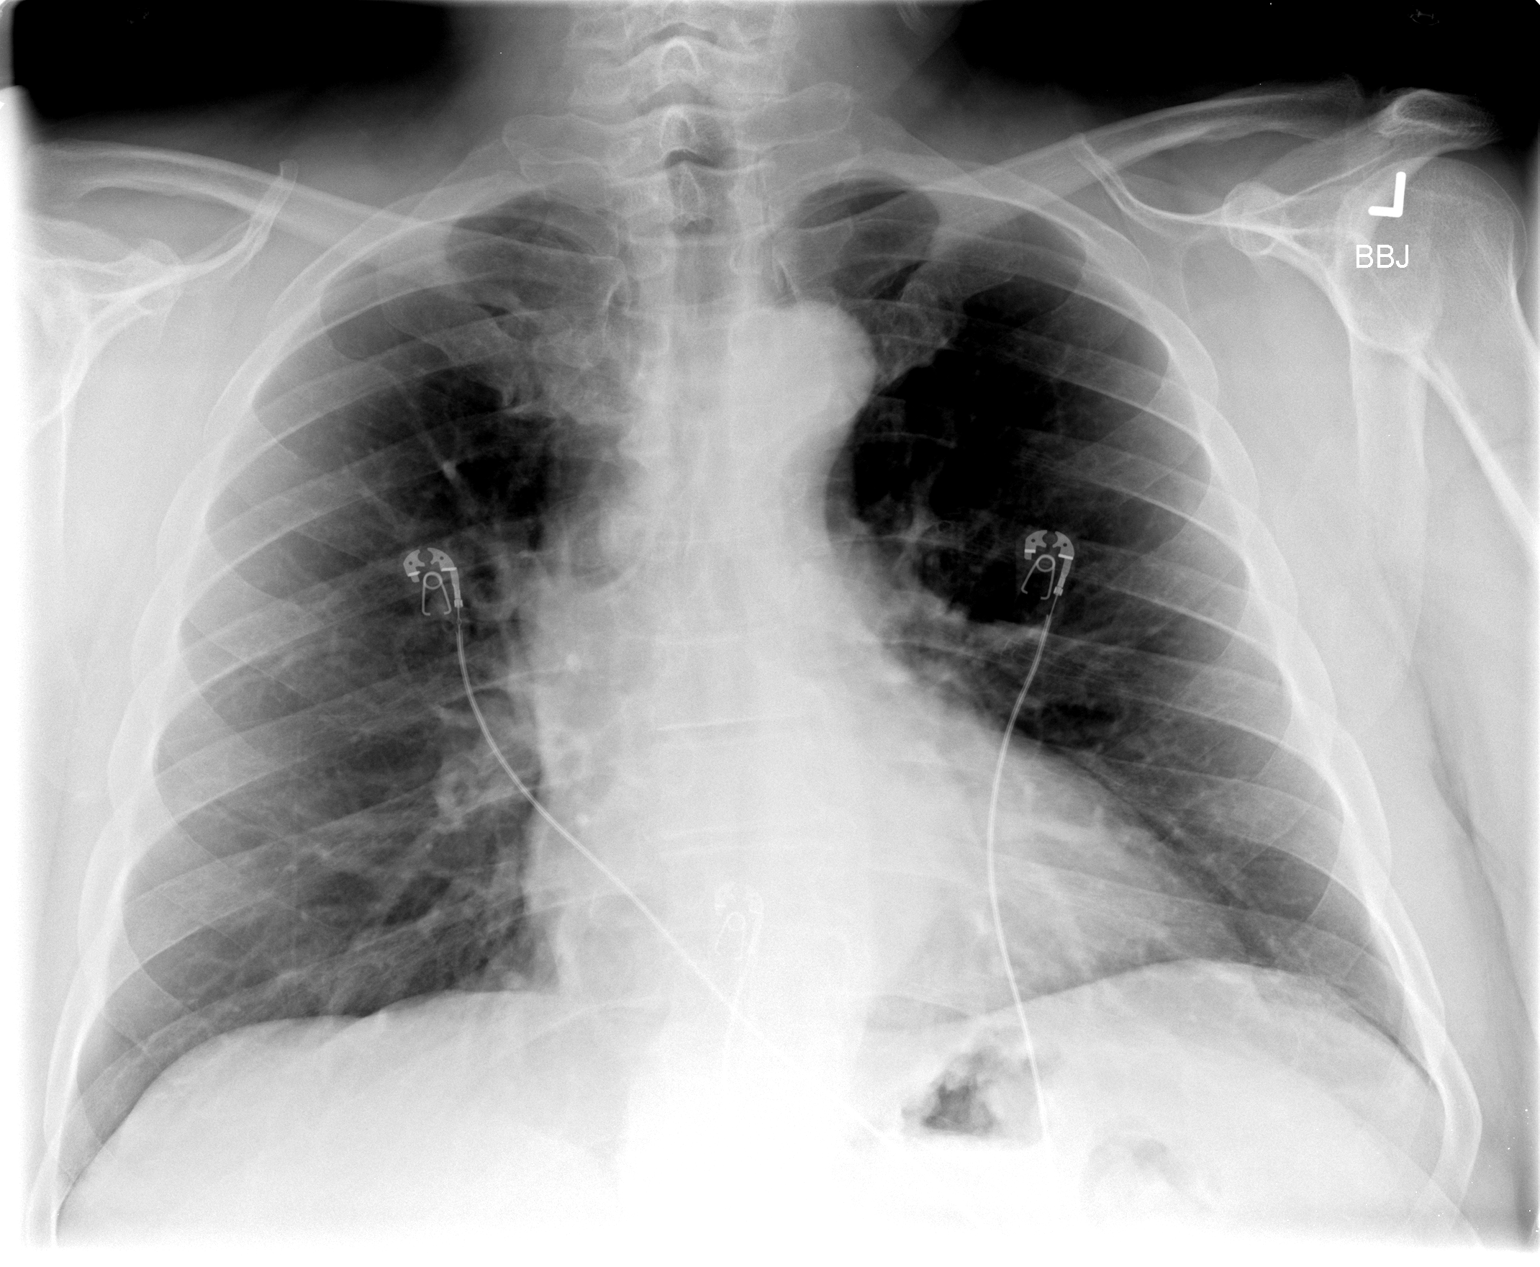

[view not recorded (2 of 2)]
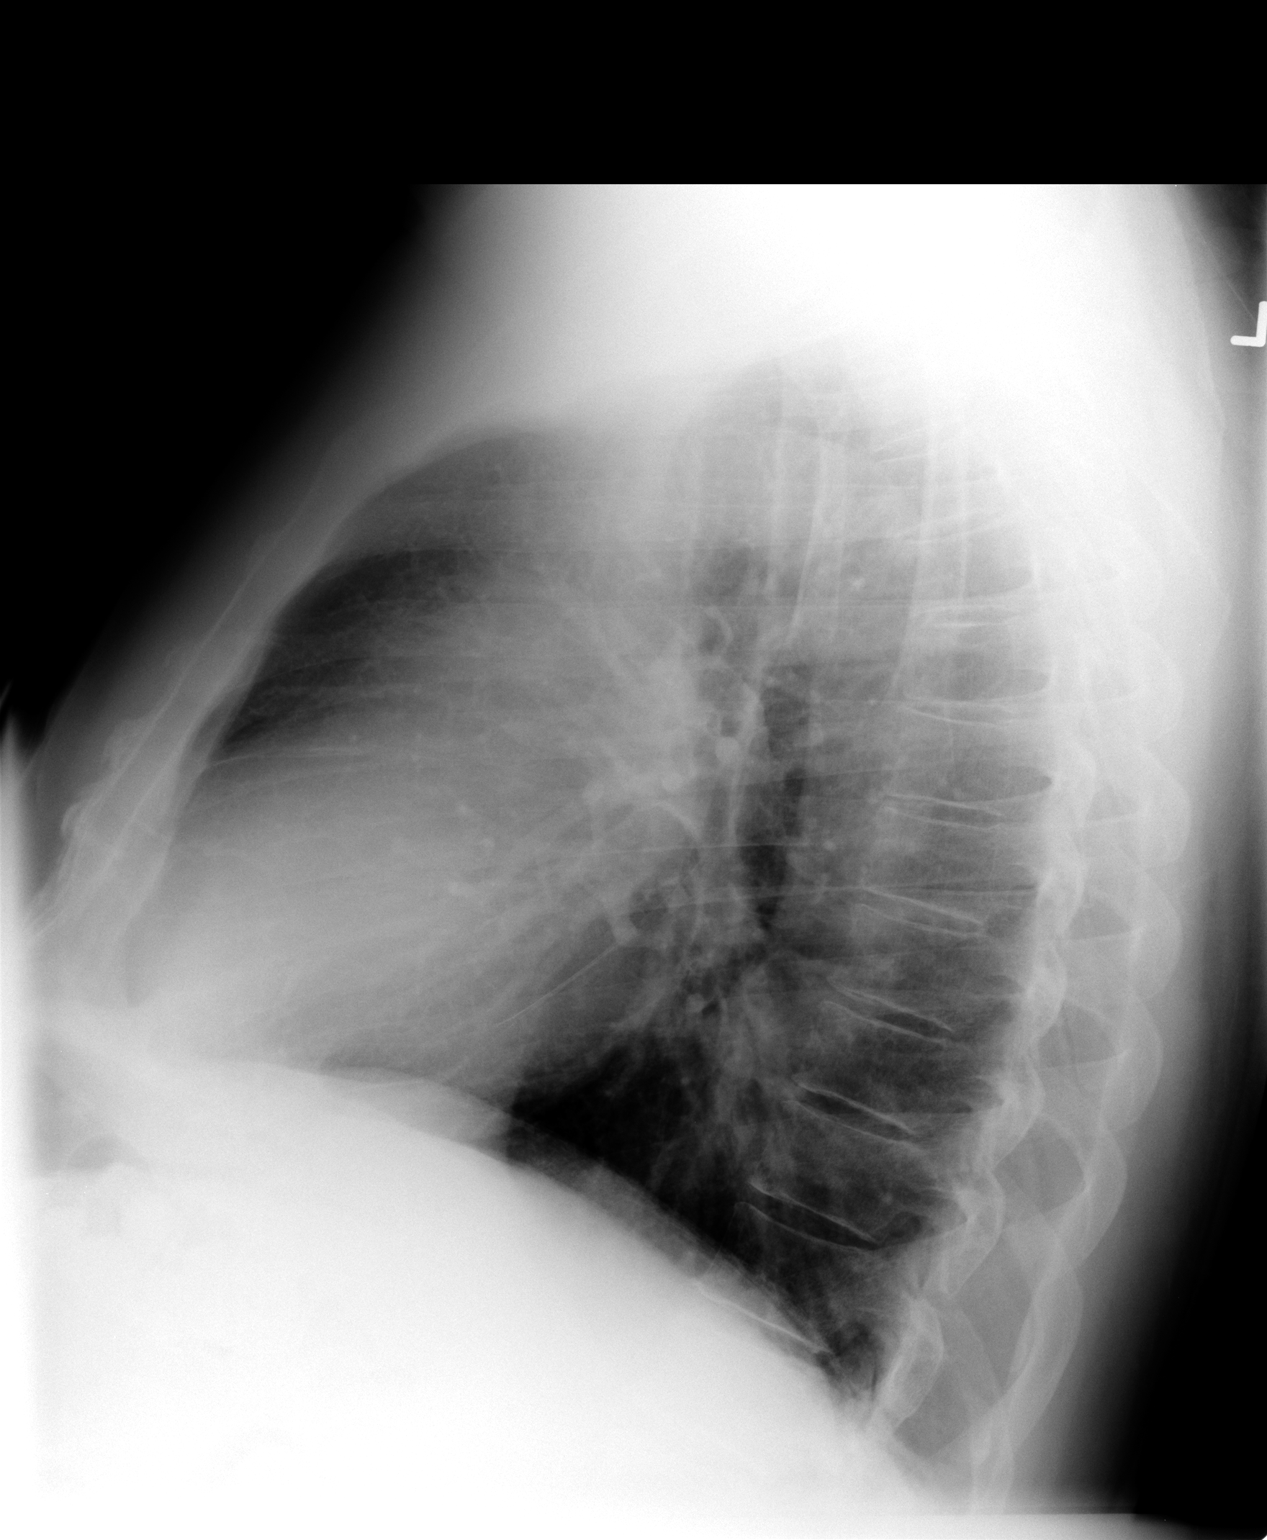

[2 of 2 positions shown; findings below may reference images not displayed]

FINDINGS: The heart size and mediastinal contours are within normal
limits.  Both lungs are clear.  The visualized skeletal structures
are unremarkable.
IMPRESSION: No active disease.

## 2014-07-01 ENCOUNTER — Encounter (INDEPENDENT_AMBULATORY_CARE_PROVIDER_SITE_OTHER): Payer: Self-pay | Admitting: *Deleted

## 2014-07-29 ENCOUNTER — Ambulatory Visit (INDEPENDENT_AMBULATORY_CARE_PROVIDER_SITE_OTHER): Payer: BC Managed Care – PPO | Admitting: Internal Medicine

## 2014-12-16 ENCOUNTER — Encounter (INDEPENDENT_AMBULATORY_CARE_PROVIDER_SITE_OTHER): Payer: Self-pay

## 2015-01-25 ENCOUNTER — Encounter: Payer: Self-pay | Admitting: Vascular Surgery

## 2015-01-26 ENCOUNTER — Ambulatory Visit (INDEPENDENT_AMBULATORY_CARE_PROVIDER_SITE_OTHER): Payer: BLUE CROSS/BLUE SHIELD | Admitting: Vascular Surgery

## 2015-01-26 ENCOUNTER — Ambulatory Visit (HOSPITAL_COMMUNITY)
Admission: RE | Admit: 2015-01-26 | Discharge: 2015-01-26 | Disposition: A | Discharge status: Home / Self Care | Payer: BLUE CROSS/BLUE SHIELD | Source: Ambulatory Visit | Attending: Vascular Surgery | Admitting: Vascular Surgery

## 2015-01-26 ENCOUNTER — Encounter: Payer: Self-pay | Admitting: Vascular Surgery

## 2015-01-26 VITALS — BP 140/86 | HR 57 | Ht 71.0 in | Wt 237.2 lb

## 2015-01-26 DIAGNOSIS — I714 Abdominal aortic aneurysm, without rupture, unspecified: Secondary | ICD-10-CM | POA: Insufficient documentation

## 2015-01-26 DIAGNOSIS — I7789 Other specified disorders of arteries and arterioles: Secondary | ICD-10-CM | POA: Insufficient documentation

## 2015-01-26 NOTE — Progress Notes (Signed)
Subjective:     Patient ID: Andres Robinson, male   DOB: Aug 25, 1956, 59 y.o.   MRN: [id number]  HPI this 59 year old male returns for continued follow-up regarding a small abdominal aortic aneurysm. This was discovered one year ago on an ultrasound at [facility] in maximum diameter was 3.0 cm. He has no symptoms of abdominal back or flank discomfort. He denies any claudication symptoms. He does have a family history of abdominal aortic aneurysm in his father who had surgery. He has had no cardiac symptoms such as chest pain, dyspnea on exertion, PND, orthopnea, hemoptysis.  Past Medical History  Diagnosis Date  . High cholesterol   . Normal cardiac stress test 2005  . DDD (degenerative disc disease), lumbar   . RMSF [facility] spotted fever) 2013  . DDD (degenerative disc disease), cervical     History  Substance Use Topics  . Smoking status: Former Research scientist (life sciences)  . Smokeless tobacco: Not on file     Comment: quit in 1982  . Alcohol Use: Yes     Comment: 2-3 glasses wine a week    Family History  Problem Relation Age of Onset  . Heart disease Father   . Diabetes Father   . Hyperlipidemia Father   . Hypertension Father   . AAA (abdominal aortic aneurysm) Father   . Peripheral vascular disease Father     amputation  . Hypertension Brother     Allergies  Allergen Reactions  . Penicillins Other (See Comments)    Childhood Reaction      Current outpatient prescriptions:  .  pantoprazole (PROTONIX) 40 MG tablet, Take 40 mg by mouth daily., Disp: , Rfl:  .  allopurinol (ZYLOPRIM) 300 MG tablet, Take 300 mg by mouth daily. , Disp: , Rfl:  .  Naphazoline-Glycerin (CLEAR EYES REDNESS RELIEF OP), Apply 2 drops to eye daily., Disp: , Rfl:  .  pravastatin (PRAVACHOL) 80 MG tablet, Take 80 mg by mouth daily., Disp: , Rfl:   BP 140/86 mmHg  Pulse 57  Ht 5\' 11"  (1.803 m)  Wt 237 lb 3.2 oz (107.593 kg)  BMI 33.10 kg/m2  SpO2 100%  Body mass index is 33.1  kg/(m^2).           Review of Systems denies claudication, lateralizing weakness, aphasia, amaurosis fugax, diplopia, blurred vision, syncope. All other systems negative and complete review of systems.     Objective:   Physical Exam BP 140/86 mmHg  Pulse 57  Ht 5\' 11"  (1.803 m)  Wt 237 lb 3.2 oz (107.593 kg)  BMI 33.10 kg/m2  SpO2 100%  Gen.-alert and oriented x3 in no apparent distress HEENT normal for age Lungs no rhonchi or wheezing Cardiovascular regular rhythm no murmurs carotid pulses 3+ palpable no bruits audible Abdomen soft nontender no palpable masses Musculoskeletal free of  major deformities Skin clear -no rashes Neurologic normal Lower extremities 3+ femoral and dorsalis pedis pulses palpable bilaterally with no edema  Today I ordered an ultrasound and duplex scan of the abdominal aorta which I reviewed and interpreted. I do not see any evidence of a true aneurysm. The maximum diameter is 2.4 cm in the proximal aorta below the renal arteries. No thrombus is noted.       Assessment:     Minimal dilatation of infrarenal abdominal aorta-no true aneurysm noted History of degenerative joint disease     Plan:     Do not think that we need to continue to follow this mildly  dilated abdominal aorta. Did inform him regarding symptoms of rupture of an abdominal aortic aneurysm in case he should develop severe pain I would recommend repeat ultrasound by his medical doctor in approximately 5 years. Return to see me on when necessary basis

## 2017-05-07 ENCOUNTER — Other Ambulatory Visit (HOSPITAL_BASED_OUTPATIENT_CLINIC_OR_DEPARTMENT_OTHER): Payer: Self-pay

## 2017-05-07 DIAGNOSIS — G4730 Sleep apnea, unspecified: Secondary | ICD-10-CM

## 2017-06-07 ENCOUNTER — Ambulatory Visit: Payer: BLUE CROSS/BLUE SHIELD | Attending: Pulmonary Disease | Admitting: Neurology

## 2017-06-07 DIAGNOSIS — G4730 Sleep apnea, unspecified: Secondary | ICD-10-CM | POA: Diagnosis present

## 2017-06-07 DIAGNOSIS — G4733 Obstructive sleep apnea (adult) (pediatric): Secondary | ICD-10-CM | POA: Diagnosis not present

## 2017-06-16 NOTE — Procedures (Signed)
[place] A. Merlene Laughter, MD     [email or web address]             NOCTURNAL POLYSOMNOGRAPHY   LOCATION: [facility]  Patient Name: Andres Robinson, Andres Robinson Date: 06/07/2017 Gender: Male D.O.B: [birth year] Age (years): 61 Referring Provider: Lennox Solders Height (inches): 71 Interpreting Physician: Phillips Odor MD, ABSM Weight (lbs): 230 RPSGT: Peak, Robert BMI: 32 MRN: [id number] Neck Size: 18.00 CLINICAL INFORMATION Sleep Study Type: Split Night CPAP  Indication for sleep study: N/A  Epworth Sleepiness Score: 2  SLEEP STUDY TECHNIQUE As per the AASM Manual for the Scoring of Sleep and Associated Events v2.3 (April 2016) with a hypopnea requiring 4% desaturations.  The channels recorded and monitored were frontal, central and occipital EEG, electrooculogram (EOG), submentalis EMG (chin), nasal and oral airflow, thoracic and abdominal wall motion, anterior tibialis EMG, snore microphone, electrocardiogram, and pulse oximetry. Continuous positive airway pressure (CPAP) was initiated when the patient met split night criteria and was titrated according to treat sleep-disordered breathing.  MEDICATIONS Medications self-administered by patient taken the night of the study : N/A  Current Outpatient Prescriptions:  .  allopurinol (ZYLOPRIM) 300 MG tablet, Take 300 mg by mouth daily. , Disp: , Rfl:  .  Naphazoline-Glycerin (CLEAR EYES REDNESS RELIEF OP), Apply 2 drops to eye daily., Disp: , Rfl:  .  pantoprazole (PROTONIX) 40 MG tablet, Take 40 mg by mouth daily., Disp: , Rfl:  .  pravastatin (PRAVACHOL) 80 MG tablet, Take 80 mg by mouth daily., Disp: , Rfl:    RESPIRATORY PARAMETERS Diagnostic  Total AHI (/hr): 46.9 RDI (/hr): 71.7 OA Index (/hr): 26.7 CA Index (/hr): 7.8 REM AHI (/hr): N/A NREM AHI (/hr): 46.9 Supine AHI (/hr): 59.3 Non-supine AHI (/hr): 21.18 Min O2 Sat (%): 87.00 Mean O2 (%): 95.35 Time below 88% (min): 0.2   Titration  Optimal Pressure  (cm): 11 AHI at Optimal Pressure (/hr): 0.0 Min O2 at Optimal Pressure (%): 95.0 Supine % at Optimal (%): 30 Sleep % at Optimal (%): 92   SLEEP ARCHITECTURE The recording time for the entire night was 439.2 minutes.  During a baseline period of 170.9 minutes, the patient slept for 130.5 minutes in REM and nonREM, yielding a sleep efficiency of 76.3%. Sleep onset after lights out was 9.4 minutes with a REM latency of N/A minutes. The patient spent 52.87% of the night in stage N1 sleep, 47.13% in stage N2 sleep, 0.00% in stage N3 and 0.00% in REM.  During the titration period of 261.5 minutes, the patient slept for 230.7 minutes in REM and nonREM, yielding a sleep efficiency of 88.3%. Sleep onset after CPAP initiation was 14.2 minutes with a REM latency of 61.5 minutes. The patient spent 8.23% of the night in stage N1 sleep, 70.53% in stage N2 sleep, 0.00% in stage N3 and 21.24% in REM.  CARDIAC DATA The 2 lead EKG demonstrated sinus rhythm. The mean heart rate was 58.06 beats per minute. Other EKG findings include: None. LEG MOVEMENT DATA The total Periodic Limb Movements of Sleep (PLMS) were 0. The PLMS index was 0.00.  IMPRESSION - Severe obstructive sleep apnea occurred during the diagnostic portion of the study (AHI = 46.9/hour). An optimal CPAP of 11 cm water is selected for this patient.    Delano Metz, MD Diplomate, American Board of Sleep Medicine.   ELECTRONICALLY SIGNED ON:  06/16/2017, 11:03 PM [place] PH: [phone]   FX: [phone] [place]  MEDICINE

## 2018-10-03 LAB — LIPID PANEL
Cholesterol: 357 — AB (ref 0–200)
HDL: 30 — AB (ref 35–70)
Triglycerides: 722 — AB (ref 40–160)

## 2018-10-03 LAB — HEMOGLOBIN A1C: Hemoglobin A1C: 6.8

## 2018-10-03 LAB — CBC: RBC: 4.72 (ref 3.87–5.11)

## 2018-10-03 LAB — PSA: PSA: 0.9

## 2018-10-03 LAB — CBC AND DIFFERENTIAL
HCT: 42 (ref 41–53)
Hemoglobin: 14.4 (ref 13.5–17.5)
Platelets: 199 (ref 150–399)

## 2018-10-03 LAB — TSH: TSH: 3.8 (ref <=5.90)

## 2019-04-10 DIAGNOSIS — G4733 Obstructive sleep apnea (adult) (pediatric): Secondary | ICD-10-CM | POA: Diagnosis not present

## 2019-05-02 DIAGNOSIS — W540XXA Bitten by dog, initial encounter: Secondary | ICD-10-CM | POA: Diagnosis not present

## 2019-05-02 DIAGNOSIS — S61452A Open bite of left hand, initial encounter: Secondary | ICD-10-CM | POA: Diagnosis not present

## 2019-07-02 DIAGNOSIS — N400 Enlarged prostate without lower urinary tract symptoms: Secondary | ICD-10-CM | POA: Diagnosis not present

## 2019-07-02 DIAGNOSIS — E119 Type 2 diabetes mellitus without complications: Secondary | ICD-10-CM | POA: Diagnosis not present

## 2019-07-02 LAB — COMPREHENSIVE METABOLIC PANEL
Albumin: 4.3 (ref 3.5–5.0)
Calcium: 9.2 (ref 8.7–10.7)
GFR calc Af Amer: 66
GFR calc non Af Amer: 57
Globulin: 2.8

## 2019-07-02 LAB — BASIC METABOLIC PANEL
BUN: 19 (ref 4–21)
BUN: 19 (ref 4–21)
CO2: 22 (ref 13–22)
Chloride: 103 (ref 99–108)
Creatinine: 1.3 (ref 0.6–1.3)
Creatinine: 1.3 (ref <=1.3)
Glucose: 174
Potassium: 4.6 (ref 3.4–5.3)
Sodium: 138 (ref 137–147)

## 2019-07-02 LAB — LIPID PANEL
Cholesterol: 173 (ref 0–200)
HDL: 36 (ref 35–70)
LDL Cholesterol: 80
Triglycerides: 286 — AB (ref 40–160)

## 2019-07-02 LAB — HEMOGLOBIN A1C: Hemoglobin A1C: 7.2

## 2019-07-09 DIAGNOSIS — N401 Enlarged prostate with lower urinary tract symptoms: Secondary | ICD-10-CM | POA: Diagnosis not present

## 2019-07-09 DIAGNOSIS — K210 Gastro-esophageal reflux disease with esophagitis: Secondary | ICD-10-CM | POA: Diagnosis not present

## 2019-07-09 DIAGNOSIS — E785 Hyperlipidemia, unspecified: Secondary | ICD-10-CM | POA: Diagnosis not present

## 2019-07-09 DIAGNOSIS — G4733 Obstructive sleep apnea (adult) (pediatric): Secondary | ICD-10-CM | POA: Diagnosis not present

## 2019-07-09 DIAGNOSIS — R69 Illness, unspecified: Secondary | ICD-10-CM | POA: Diagnosis not present

## 2019-08-22 DIAGNOSIS — R69 Illness, unspecified: Secondary | ICD-10-CM | POA: Diagnosis not present

## 2019-08-29 DIAGNOSIS — G4733 Obstructive sleep apnea (adult) (pediatric): Secondary | ICD-10-CM | POA: Diagnosis not present

## 2019-09-16 ENCOUNTER — Encounter: Payer: Self-pay | Admitting: "Endocrinology

## 2019-09-16 ENCOUNTER — Other Ambulatory Visit: Payer: Self-pay

## 2019-09-16 ENCOUNTER — Ambulatory Visit (INDEPENDENT_AMBULATORY_CARE_PROVIDER_SITE_OTHER): Payer: Medicare HMO | Admitting: "Endocrinology

## 2019-09-16 VITALS — BP 147/95 | HR 70 | Temp 97.3°F | Ht 71.0 in | Wt 231.0 lb

## 2019-09-16 DIAGNOSIS — E782 Mixed hyperlipidemia: Secondary | ICD-10-CM

## 2019-09-16 DIAGNOSIS — E1122 Type 2 diabetes mellitus with diabetic chronic kidney disease: Secondary | ICD-10-CM | POA: Diagnosis not present

## 2019-09-16 DIAGNOSIS — N182 Chronic kidney disease, stage 2 (mild): Secondary | ICD-10-CM

## 2019-09-16 DIAGNOSIS — E119 Type 2 diabetes mellitus without complications: Secondary | ICD-10-CM | POA: Insufficient documentation

## 2019-09-16 DIAGNOSIS — E1169 Type 2 diabetes mellitus with other specified complication: Secondary | ICD-10-CM | POA: Insufficient documentation

## 2019-09-16 HISTORY — DX: Type 2 diabetes mellitus with diabetic chronic kidney disease: E11.22

## 2019-09-16 NOTE — Progress Notes (Signed)
Endocrinology Consult Note       09/16/2019, 1:43 PM   Subjective:    Patient ID: Andres Robinson, male    DOB: 1956/01/27.  Andres Robinson is being seen in consultation for management of currently uncontrolled symptomatic diabetes requested by  Sinda Du, MD.   Past Medical History:  Diagnosis Date  . DDD (degenerative disc disease), cervical   . DDD (degenerative disc disease), lumbar   . High cholesterol   . Normal cardiac stress test 2005  . RMS[facility] spotted fever) 2013  . Type 2 diabetes mellitus with stage 2 chronic kidney disease, without long-term current use of insulin ([place]) 09/16/2019    Past Surgical History:  Procedure Laterality Date  . CATARACT EXTRACTION Bilateral   . COLONOSCOPY    . ESOPHAGOGASTRODUODENOSCOPY N/A 01/14/2014   Procedure: ESOPHAGOGASTRODUODENOSCOPY (EGD);  Surgeon: Rogene Houston, MD;  Location: [facility];  Service: Endoscopy;  Laterality: N/A;  320    Social History   Socioeconomic History  . Marital status: Married    Spouse name: Not on file  . Number of children: Not on file  . Years of education: Not on file  . Highest education level: Not on file  Occupational History  . Not on file  Social Needs  . Financial resource strain: Not on file  . Food insecurity    Worry: Not on file    Inability: Not on file  . Transportation needs    Medical: Not on file    Non-medical: Not on file  Tobacco Use  . Smoking status: Former Research scientist (life sciences)  . Smokeless tobacco: Never Used  . Tobacco comment: quit in 1982  Substance and Sexual Activity  . Alcohol use: Yes    Comment: 2-3 glasses wine a week  . Drug use: No  . Sexual activity: Yes    Partners: Female  Lifestyle  . Physical activity    Days per week: Not on file    Minutes per session: Not on file  . Stress: Not on file  Relationships  . Social Herbalist on phone: Not on file     Gets together: Not on file    Attends religious service: Not on file    Active member of club or organization: Not on file    Attends meetings of clubs or organizations: Not on file    Relationship status: Not on file  Other Topics Concern  . Not on file  Social History Narrative  . Not on file    Family History  Problem Relation Age of Onset  . Heart disease Father   . Diabetes Father   . Hyperlipidemia Father   . Hypertension Father   . AAA (abdominal aortic aneurysm) Father   . Peripheral vascular disease Father        amputation  . Hypertension Brother     Outpatient Encounter Medications as of 09/16/2019  Medication Sig  . metFORMIN (GLUCOPHAGE) 500 MG tablet Take by mouth 2 (two) times daily with a meal.  . simvastatin (ZOCOR) 20 MG tablet Take 20 mg by mouth daily.  Marland Kitchen  tamsulosin (FLOMAX) 0.4 MG CAPS capsule Take 0.4 mg by mouth 2 (two) times daily.  . [DISCONTINUED] allopurinol (ZYLOPRIM) 300 MG tablet Take 300 mg by mouth daily.   . [DISCONTINUED] Naphazoline-Glycerin (CLEAR EYES REDNESS RELIEF OP) Apply 2 drops to eye daily.  . [DISCONTINUED] pantoprazole (PROTONIX) 40 MG tablet Take 40 mg by mouth daily.  . [DISCONTINUED] pravastatin (PRAVACHOL) 80 MG tablet Take 80 mg by mouth daily.   No facility-administered encounter medications on file as of 09/16/2019.     ALLERGIES: Allergies  Allergen Reactions  . Penicillins Other (See Comments)    Childhood Reaction     VACCINATION STATUS:  There is no immunization history on file for this patient.  Diabetes He presents for his initial diabetic visit. He has type 2 diabetes mellitus. Onset time: He was diagnosed at approximate age of 54 years. His disease course has been stable. There are no hypoglycemic associated symptoms. Pertinent negatives for hypoglycemia include no confusion, headaches, pallor or seizures. There are no diabetic associated symptoms. Pertinent negatives for diabetes include no chest pain, no  fatigue, no polydipsia, no polyphagia, no polyuria and no weakness. There are no hypoglycemic complications. Symptoms are improving. There are no diabetic complications. Risk factors for coronary artery disease include diabetes mellitus, dyslipidemia, male sex, obesity, tobacco exposure, sedentary lifestyle and family history. Current diabetic treatment includes oral agent (monotherapy). He is following a generally unhealthy diet. When asked about meal planning, he reported none. He has not had a previous visit with a dietitian. He rarely participates in exercise. His home blood glucose trend is decreasing steadily. (He brought a meter showing average blood glucose of 154, on metformin 500 mg p.o. twice daily.  His A1c in July 2020 was 7.2%.) An ACE inhibitor/angiotensin II receptor blocker is not being taken. Eye exam is current.  Hyperlipidemia This is a chronic problem. The current episode started more than 1 year ago. The problem is uncontrolled. Exacerbating diseases include diabetes and obesity. Pertinent negatives include no chest pain, myalgias or shortness of breath. Current antihyperlipidemic treatment includes statins. Risk factors for coronary artery disease include dyslipidemia, diabetes mellitus, obesity, male sex and a sedentary lifestyle.     Review of Systems  Constitutional: Negative for chills, fatigue, fever and unexpected weight change.  HENT: Negative for dental problem, mouth sores and trouble swallowing.   Eyes: Negative for visual disturbance.  Respiratory: Negative for cough, choking, chest tightness, shortness of breath and wheezing.   Cardiovascular: Negative for chest pain, palpitations and leg swelling.  Gastrointestinal: Negative for abdominal distention, abdominal pain, constipation, diarrhea, nausea and vomiting.  Endocrine: Negative for polydipsia, polyphagia and polyuria.  Genitourinary: Negative for dysuria, flank pain, hematuria and urgency.  Musculoskeletal:  Negative for back pain, gait problem, myalgias and neck pain.  Skin: Negative for pallor, rash and wound.  Neurological: Negative for seizures, syncope, weakness, numbness and headaches.  Psychiatric/Behavioral: Negative for confusion and dysphoric mood.    Objective:    BP (!) 147/95 (BP Location: Right Arm, Patient Position: Sitting, Cuff Size: Normal)   Pulse 70   Temp (!) 97.3 F (36.3 C) (Oral)   Ht 5\' 11"  (1.803 m)   Wt 231 lb (104.8 kg)   SpO2 97%   BMI 32.22 kg/m   Wt Readings from Last 3 Encounters:  09/16/19 231 lb (104.8 kg)  06/08/17 230 lb (104.3 kg)  01/26/15 237 lb 3.2 oz (107.6 kg)     Physical Exam Constitutional:      General: He  is not in acute distress.    Appearance: He is well-developed.  HENT:     Head: Normocephalic and atraumatic.  Neck:     Musculoskeletal: Normal range of motion and neck supple.     Thyroid: No thyromegaly.     Trachea: No tracheal deviation.  Cardiovascular:     Rate and Rhythm: Normal rate.     Pulses:          Dorsalis pedis pulses are 1+ on the right side and 1+ on the left side.       Posterior tibial pulses are 1+ on the right side and 1+ on the left side.     Heart sounds: S1 normal and S2 normal. No murmur. No gallop.   Pulmonary:     Effort: Pulmonary effort is normal. No respiratory distress.     Breath sounds: No wheezing.  Abdominal:     General: There is no distension.     Tenderness: There is no abdominal tenderness. There is no guarding.  Musculoskeletal:     Right shoulder: He exhibits no swelling and no deformity.  Skin:    General: Skin is warm and dry.     Findings: No rash.     Nails: There is no clubbing.   Neurological:     Mental Status: He is alert and oriented to person, place, and time.     Cranial Nerves: No cranial nerve deficit.     Sensory: No sensory deficit.     Gait: Gait normal.     Deep Tendon Reflexes: Reflexes are normal and symmetric.  Psychiatric:        Speech: Speech normal.         Behavior: Behavior normal. Behavior is cooperative.        Thought Content: Thought content normal.        Judgment: Judgment normal.       CMP ( most recent) CMP     Component Value Date/Time   NA 140 06/04/2013 2025   K 4.0 06/04/2013 2025   CL 101 06/04/2013 2025   CO2 27 06/04/2013 2025   GLUCOSE 128 (H) 06/04/2013 2025   BUN 19 07/02/2019   CREATININE 1.3 07/02/2019   CREATININE 1.06 06/04/2013 2025   CALCIUM 9.6 06/04/2013 2025   GFRNONAA 75 (L) 06/04/2013 2025   GFRAA 87 (L) 06/04/2013 2025     Diabetic Labs (most recent): Lab Results  Component Value Date   HGBA1C 7.2 07/02/2019   HGBA1C 6.8 10/03/2018     Lipid Panel ( most recent) Lipid Panel     Component Value Date/Time   CHOL 173 07/02/2019   TRIG 286 (A) 07/02/2019   HDL 36 07/02/2019   [place]arton 80 07/02/2019      Assessment & Plan:   1. Type 2 diabetes mellitus with stage 2 chronic kidney disease, without long-term current use of insulin (HCC)  - Wash Mullaly Paynter has currently controlled asymptomatic type 2 DM since  63 years of age,  with most recent A1c of 7.2 %. Recent labs reviewed. - I had a long discussion with him about the progressive nature of diabetes and the pathology behind its complications. -He does has mild microalbuminuria, no gross complications , however, he remains at a high risk for more acute and chronic complications which include CAD, CVA, CKD, retinopathy, and neuropathy. These are all discussed in detail with him.  - I have counseled him on diet  and weight management  by adopting a  carbohydrate restricted/protein rich diet. Patient is encouraged to switch to  unprocessed or minimally processed     complex starch and increased protein intake (animal or plant source), fruits, and vegetables. -  he is advised to stick to a routine mealtimes to eat 3 meals  a day and avoid unnecessary snacks ( to snack only to correct hypoglycemia).   - he admits that there is a room for  improvement in his food and drink choices. - Suggestion is made for him to avoid simple carbohydrates  from his diet including Cakes, Sweet Desserts, Ice Cream, Soda (diet and regular), Sweet Tea, Candies, Chips, Cookies, Store Bought Juices, Alcohol in Excess of  1-2 drinks a day, Artificial Sweeteners,  Coffee Creamer, and "Sugar-free" Products. This will help patient to have more stable blood glucose profile and potentially avoid unintended weight gain.  - he will be scheduled with Jearld Fenton, RDN, CDE for diabetes education.  - I have approached him with the following individualized plan to manage  his diabetes and patient agrees:   -Based on his adequate response to metformin therapy, he will not require insulin treatment at this time.  He is advised to continue metformin 500 mg p.o. twice daily after breakfast and supper. -He has several options if he loses control of diabetes during his next visit, including glipizide therapy, insulin, incretin therapy as appropriate. - Specific targets for  A1c;  LDL, HDL, Triglycerides, and  Waist Circumference were discussed with the patient.  2) Blood Pressure /Hypertension: He denies any prior knowledge of hypertension, did have elevated blood pressure reading of 147/95 today.  He is advised to obtain home monitor for blood pressure and report if his readings are greater than 140/90. He will be considered for treatment if he is next visit blood pressures greater than 130/80 mmHg. 3) Lipids/Hyperlipidemia:   Review of his recent lipid panel showed controlled  LDL at 80 .  he  is advised to continue    simvastatin 20 mg daily at bedtime.  Side effects and precautions discussed with him.  4)  Weight/Diet:  Body mass index is 32.22 kg/m.  -   clearly complicating his diabetes care.   he is  a candidate for weight loss. I discussed with him the fact that loss of 5 - 10% of his  current body weight will have the most impact on his diabetes management.   Exercise, and detailed carbohydrates information provided  -  detailed on discharge instructions.  5) Chronic Care/Health Maintenance:  -he  is on Statin medications and  is encouraged to initiate and continue to follow up with Ophthalmology, Dentist,  Podiatrist at least yearly or according to recommendations, and advised to   stay away from smoking. I have recommended yearly flu vaccine and pneumonia vaccine at least every 5 years; moderate intensity exercise for up to 150 minutes weekly; and  sleep for at least 7 hours a day.  - he is  advised to maintain close follow up with Sinda Du, MD for primary care needs, as well as his other providers for optimal and coordinated care.  - Time spent with the patient: 45 minutes, of which >50% was spent in obtaining information about his symptoms, reviewing his previous labs/studies, evaluations, and treatments, counseling him about his currently controlled type 2 diabetes, hyperlipidemia, and developing plans for long term treatment based on the latest standards of care/guidelines.  Please refer to " Patient Self Inventory" in the Media  tab for reviewed elements  of pertinent patient history.  [name] participated in the discussions, expressed understanding, and voiced agreement with the above plans.  All questions were answered to his satisfaction. he is encouraged to contact clinic should he have any questions or concerns prior to his return visit.  Follow up plan: - Return in about 3 months (around 12/16/2019) for Next Visit A1c in Office.  Glade Lloyd, MD [facility] [address] [place], [place] [address] Phone: [phone]  Fax: [phone]    09/16/2019, 1:43 PM  This note was partially dictated with voice recognition software. Similar sounding words can be transcribed inadequately or may not  be corrected upon review.

## 2019-09-16 NOTE — Patient Instructions (Signed)

## 2019-09-28 DIAGNOSIS — G4733 Obstructive sleep apnea (adult) (pediatric): Secondary | ICD-10-CM | POA: Diagnosis not present

## 2019-10-15 ENCOUNTER — Encounter: Payer: Medicare HMO | Attending: "Endocrinology | Admitting: Nutrition

## 2019-10-15 ENCOUNTER — Other Ambulatory Visit: Payer: Self-pay

## 2019-10-15 ENCOUNTER — Encounter: Payer: Self-pay | Admitting: Nutrition

## 2019-10-15 VITALS — Ht 71.0 in | Wt 224.0 lb

## 2019-10-15 DIAGNOSIS — E1122 Type 2 diabetes mellitus with diabetic chronic kidney disease: Secondary | ICD-10-CM

## 2019-10-15 DIAGNOSIS — K219 Gastro-esophageal reflux disease without esophagitis: Secondary | ICD-10-CM | POA: Diagnosis not present

## 2019-10-15 DIAGNOSIS — E782 Mixed hyperlipidemia: Secondary | ICD-10-CM | POA: Insufficient documentation

## 2019-10-15 DIAGNOSIS — N182 Chronic kidney disease, stage 2 (mild): Secondary | ICD-10-CM | POA: Diagnosis not present

## 2019-10-15 NOTE — Patient Instructions (Signed)
Follow up 3 months  Follow My Plate   Eat 3-4 carb choices per meal Drink water Don't skip meals Increase fresh fruits and vegetables Walk 30-60 minutes Get A1C to 6.5% or less.

## 2019-10-23 DIAGNOSIS — R69 Illness, unspecified: Secondary | ICD-10-CM | POA: Diagnosis not present

## 2019-10-27 ENCOUNTER — Encounter: Payer: Self-pay | Admitting: Nutrition

## 2019-10-27 NOTE — Progress Notes (Signed)
  Medical Nutrition Therapy:  Appt start time: 1600 end time:  1700.   Assessment:  Primary concerns today: Diabetes Type 2. Sees Dr. Dorris Fetch. Wants to know how to better [place] CHO and improve his BS's. Eats 2-3 meals per day. Metfomrin 500 mg a day. Eats later at night at times. Lab Results  Component Value Date   HGBA1C 7.2 07/02/2019   CMP Latest Ref Rng & Units 07/02/2019 06/04/2013  Glucose 70 - 99 mg/dL - 128(H)  BUN 4 - 21 19 18   Creatinine 0.6 - 1.3 1.3 1.06  Sodium 135 - 145 mEq/L - 140  Potassium 3.5 - 5.1 mEq/L - 4.0  Chloride 96 - 112 mEq/L - 101  CO2 19 - 32 mEq/L - 27  Calcium 8.4 - 10.5 mg/dL - 9.6     Preferred Learning Style:    No preference indicated    Learning Readiness:    Ready  Change in progress   MEDICATIONS:    DIETARY INTAKE:   24-hr recal He eats 2-3 meals per day.   Usual physical activity: walks some.   Estimated energy needs: 1800  calories 200 g carbohydrates 135  g protein 50 g fat  Progress Towards Goal(s):  In progress.   Nutritional Diagnosis:  NB-1.1 Food and nutrition-related knowledge deficit As related to Diabetes.  As evidenced by A1C 7.2%.    Intervention: Nutrition and Diabetes education provided on My Plate, CHO counting, meal planning, portion sizes, timing of meals, avoiding snacks between meals unless having a low blood sugar, target ranges for A1C and blood sugars, signs/symptoms and treatment of hyper/hypoglycemia, monitoring blood sugars, taking medications as prescribed, benefits of exercising 30 minutes per day and prevention of complications of DM. Marland KitchenFollow up 3 months  Follow My Plate   Eat 3-4 carb choices per meal Drink water Don't skip meals Increase fresh fruits and vegetables Walk 30-60 minutes Get A1C to 6.5% or less.  Teaching Method Utilized: Visual Auditory Hands on  Handouts given during visit include:  The Plate Method   Diabetes Instructions.   Barriers to learning/adherence  to lifestyle change: none  Demonstrated degree of understanding via:  Teach Back   Monitoring/Evaluation:  Dietary intake, exercise, , and body weight in 3 month(s).

## 2019-10-29 DIAGNOSIS — G4733 Obstructive sleep apnea (adult) (pediatric): Secondary | ICD-10-CM | POA: Diagnosis not present

## 2019-11-27 DIAGNOSIS — G4733 Obstructive sleep apnea (adult) (pediatric): Secondary | ICD-10-CM | POA: Diagnosis not present

## 2019-12-16 DIAGNOSIS — R69 Illness, unspecified: Secondary | ICD-10-CM | POA: Diagnosis not present

## 2019-12-23 ENCOUNTER — Encounter: Payer: Self-pay | Admitting: Nutrition

## 2019-12-23 ENCOUNTER — Other Ambulatory Visit: Payer: Self-pay

## 2019-12-23 ENCOUNTER — Encounter: Payer: Self-pay | Admitting: "Endocrinology

## 2019-12-23 ENCOUNTER — Ambulatory Visit: Payer: Medicare HMO | Admitting: "Endocrinology

## 2019-12-23 ENCOUNTER — Encounter: Payer: Medicare HMO | Attending: "Endocrinology | Admitting: Nutrition

## 2019-12-23 VITALS — Ht 71.0 in | Wt 222.0 lb

## 2019-12-23 VITALS — BP 126/74 | HR 70 | Ht 71.0 in | Wt 222.0 lb

## 2019-12-23 DIAGNOSIS — E782 Mixed hyperlipidemia: Secondary | ICD-10-CM | POA: Diagnosis not present

## 2019-12-23 DIAGNOSIS — N182 Chronic kidney disease, stage 2 (mild): Secondary | ICD-10-CM | POA: Diagnosis not present

## 2019-12-23 DIAGNOSIS — E669 Obesity, unspecified: Secondary | ICD-10-CM

## 2019-12-23 DIAGNOSIS — E1122 Type 2 diabetes mellitus with diabetic chronic kidney disease: Secondary | ICD-10-CM | POA: Diagnosis not present

## 2019-12-23 LAB — POCT GLYCOSYLATED HEMOGLOBIN (HGB A1C): Hemoglobin A1C: 5.4 % (ref 4.0–5.6)

## 2019-12-23 NOTE — Progress Notes (Signed)
  Medical Nutrition Therapy:  Appt start time: [id number] end time:  1330   Assessment:  Primary concerns today: Diabetes Type 2. Sees Dr. Dorris Fetch. Saw him today. His wife is with him today. Changes made: more vegetables, portions and eating meals on time. No low blood sugars. Feels great. A1C down to 5.4% from 7.2%. Lost about 9+ lbs since 3 months ago. Metfomrin 500 mg a day. Not eating late at night anymore.   Lab Results  Component Value Date   HGBA1C 5.4 12/23/2019   CMP Latest Ref Rng & Units 07/02/2019 07/02/2019 06/04/2013  Glucose 70 - 99 mg/dL - - 128(H)  BUN 4 - 21 19 19 18   Creatinine 0.6 - 1.3 1.3 1.3 1.06  Sodium 137 - 147 138 - 140  Potassium 3.4 - 5.3 4.6 - 4.0  Chloride 99 - 108 103 - 101  CO2 13 - 22 22 - 27  Calcium 8.7 - 10.7 - 9.2 9.6     Preferred Learning Style:    No preference indicated    Learning Readiness:    Ready  Change in progress   MEDICATIONS:    DIETARY INTAKE:   24-hr recal He eats 2-3 meals per day.   Usual physical activity: walks some.   Estimated energy needs: 1800  calories 200 g carbohydrates 135  g protein 50 g fat  Progress Towards Goal(s):  In progress.   Nutritional Diagnosis:  NB-1.1 Food and nutrition-related knowledge deficit As related to Diabetes.  As evidenced by A1C 7.2%.    Intervention: Nutrition and Diabetes education provided on My Plate, CHO counting, meal planning, portion sizes, timing of meals, avoiding snacks between meals unless having a low blood sugar, target ranges for A1C and blood sugars, signs/symptoms and treatment of hyper/hypoglycemia, monitoring blood sugars, taking medications as prescribed, benefits of exercising 30 minutes per day and prevention of complications of DM. Marland KitchenFollow up 3 months  Goals Keep up the great job! Keep drinking water Eat meals on time. Keep A1C 5.7% OR below. Lose 2-3 lbs per month-Goal wt 210-215 lbs.  Teaching Method Utilized: Visual Auditory Hands  on  Handouts given during visit include:  The Plate Method   Diabetes Instructions.   Barriers to learning/adherence to lifestyle change: none  Demonstrated degree of understanding via:  Teach Back   Monitoring/Evaluation:  Dietary intake, exercise, , and body weight in 3 month(s).

## 2019-12-23 NOTE — Patient Instructions (Signed)
  Goals Keep up the great job! Keep drinking water Eat meals on time. Keep A1C 5.7% OR below. Lose 2-3 lbs per month-Goal wt 210-215 lbs.

## 2019-12-23 NOTE — Progress Notes (Signed)
12/23/2019, 1:28 PM  Endocrinology follow-up note   Subjective:    Patient ID: Andres Robinson, male    DOB: 05-31-1956.  Andres Robinson is being seen in  Follow up after he was seen in consultation for management of currently uncontrolled symptomatic diabetes requested by  Sinda Du, MD.   Past Medical History:  Diagnosis Date  . DDD (degenerative disc disease), cervical   . DDD (degenerative disc disease), lumbar   . High cholesterol   . Normal cardiac stress test 2005  . RMSF [facility] spotted fever) 2013  . Type 2 diabetes mellitus with stage 2 chronic kidney disease, without long-term current use of insulin ([place]) 09/16/2019    Past Surgical History:  Procedure Laterality Date  . CATARACT EXTRACTION Bilateral   . COLONOSCOPY    . ESOPHAGOGASTRODUODENOSCOPY N/A 01/14/2014   Procedure: ESOPHAGOGASTRODUODENOSCOPY (EGD);  Surgeon: Rogene Houston, MD;  Location: [facility];  Service: Endoscopy;  Laterality: N/A;  320    Social History   Socioeconomic History  . Marital status: Married    Spouse name: Not on file  . Number of children: Not on file  . Years of education: Not on file  . Highest education level: Not on file  Occupational History  . Not on file  Tobacco Use  . Smoking status: Former Research scientist (life sciences)  . Smokeless tobacco: Never Used  . Tobacco comment: quit in 1982  Substance and Sexual Activity  . Alcohol use: Yes    Comment: 2-3 glasses wine a week  . Drug use: No  . Sexual activity: Yes    Partners: Female  Other Topics Concern  . Not on file  Social History Narrative  . Not on file   Social Determinants of Health   Financial Resource Strain:   . Difficulty of Paying Living Expenses: Not on file  Food Insecurity:   . Worried About Charity fundraiser in the Last Year: Not on file  . Ran Out of Food in the Last Year: Not on file  Transportation Needs:   . Lack of Transportation (Medical): Not on file  . Lack of  Transportation (Non-Medical): Not on file  Physical Activity:   . Days of Exercise per Week: Not on file  . Minutes of Exercise per Session: Not on file  Stress:   . Feeling of Stress : Not on file  Social Connections:   . Frequency of Communication with Friends and Family: Not on file  . Frequency of Social Gatherings with Friends and Family: Not on file  . Attends Religious Services: Not on file  . Active Member of Clubs or Organizations: Not on file  . Attends Archivist Meetings: Not on file  . Marital Status: Not on file    Family History  Problem Relation Age of Onset  . Heart disease Father   . Diabetes Father   . Hyperlipidemia Father   . Hypertension Father   . AAA (abdominal aortic aneurysm) Father   . Peripheral vascular disease Father        amputation  . Hypertension Brother     Outpatient Encounter Medications as of 12/23/2019  Medication Sig  . metFORMIN (GLUCOPHAGE) 500 MG tablet Take 500 mg by mouth daily with breakfast.  . simvastatin (ZOCOR) 20 MG tablet Take 20 mg by mouth daily.  . tamsulosin (FLOMAX) 0.4 MG CAPS capsule Take 0.4 mg by mouth 2 (two) times daily.   No  facility-administered encounter medications on file as of 12/23/2019.    ALLERGIES: Allergies  Allergen Reactions  . Penicillins Other (See Comments)    Childhood Reaction     VACCINATION STATUS: Immunization History  Administered Date(s) Administered  . Tdap 10/07/2018    Diabetes He presents for his follow-up diabetic visit. He has type 2 diabetes mellitus. Onset time: He was diagnosed at approximate age of 58 years. His disease course has been improving. There are no hypoglycemic associated symptoms. Pertinent negatives for hypoglycemia include no confusion, headaches, pallor or seizures. There are no diabetic associated symptoms. Pertinent negatives for diabetes include no chest pain, no fatigue, no polydipsia, no polyphagia, no polyuria and no weakness. There are no  hypoglycemic complications. Symptoms are improving. There are no diabetic complications. Risk factors for coronary artery disease include diabetes mellitus, dyslipidemia, male sex, obesity, tobacco exposure, sedentary lifestyle and family history. Current diabetic treatment includes oral agent (monotherapy). His weight is decreasing steadily. He is following a generally unhealthy diet. When asked about meal planning, he reported none. He has not had a previous visit with a dietitian. He rarely participates in exercise. His home blood glucose trend is decreasing steadily. His breakfast blood glucose range is generally 110-130 mg/dl. His overall blood glucose range is 110-130 mg/dl. (He brought a meter showing average blood glucose of 126,  on metformin 500 mg p.o. twice daily.  His A1c in office today is 5.4% ,  July 2020 was 7.2%.) An ACE inhibitor/angiotensin II receptor blocker is not being taken. Eye exam is current.  Hyperlipidemia This is a chronic problem. The current episode started more than 1 year ago. The problem is uncontrolled. Exacerbating diseases include diabetes and obesity. Pertinent negatives include no chest pain, myalgias or shortness of breath. Current antihyperlipidemic treatment includes statins. Risk factors for coronary artery disease include dyslipidemia, diabetes mellitus, obesity, male sex and a sedentary lifestyle.     Review of Systems  Constitutional: Negative for chills, fatigue, fever and unexpected weight change.  HENT: Negative for dental problem, mouth sores and trouble swallowing.   Eyes: Negative for visual disturbance.  Respiratory: Negative for cough, choking, chest tightness, shortness of breath and wheezing.   Cardiovascular: Negative for chest pain, palpitations and leg swelling.  Gastrointestinal: Negative for abdominal distention, abdominal pain, constipation, diarrhea, nausea and vomiting.  Endocrine: Negative for polydipsia, polyphagia and polyuria.   Genitourinary: Negative for dysuria, flank pain, hematuria and urgency.  Musculoskeletal: Negative for back pain, gait problem, myalgias and neck pain.  Skin: Negative for pallor, rash and wound.  Neurological: Negative for seizures, syncope, weakness, numbness and headaches.  Psychiatric/Behavioral: Negative for confusion and dysphoric mood.    Objective:    BP 126/74   Pulse 70   Ht 5\' 11"  (1.803 m)   Wt 222 lb (100.7 kg)   BMI 30.96 kg/m   Wt Readings from Last 3 Encounters:  12/23/19 222 lb (100.7 kg)  10/15/19 224 lb (101.6 kg)  09/16/19 231 lb (104.8 kg)     Physical Exam Constitutional:      General: He is not in acute distress.    Appearance: He is well-developed.  HENT:     Head: Normocephalic and atraumatic.  Neck:     Thyroid: No thyromegaly.     Trachea: No tracheal deviation.  Cardiovascular:     Rate and Rhythm: Normal rate.     Pulses:          Dorsalis pedis pulses are 1+ on the right side  and 1+ on the left side.       Posterior tibial pulses are 1+ on the right side and 1+ on the left side.     Heart sounds: S1 normal and S2 normal. No murmur. No gallop.   Pulmonary:     Effort: Pulmonary effort is normal. No respiratory distress.     Breath sounds: No wheezing.  Abdominal:     General: There is no distension.     Tenderness: There is no abdominal tenderness. There is no guarding.  Musculoskeletal:     Right shoulder: No swelling or deformity.     Cervical back: Normal range of motion and neck supple.  Skin:    General: Skin is warm and dry.     Findings: No rash.     Nails: There is no clubbing.  Neurological:     Mental Status: He is alert and oriented to person, place, and time.     Cranial Nerves: No cranial nerve deficit.     Sensory: No sensory deficit.     Gait: Gait normal.     Deep Tendon Reflexes: Reflexes are normal and symmetric.  Psychiatric:        Speech: Speech normal.        Behavior: Behavior normal. Behavior is  cooperative.        Thought Content: Thought content normal.        Judgment: Judgment normal.      CMP     Component Value Date/Time   NA 138 07/02/2019 0000   K 4.6 07/02/2019 0000   CL 103 07/02/2019 0000   CO2 22 07/02/2019 0000   GLUCOSE 128 (H) 06/04/2013 2025   BUN 19 07/02/2019 0000   BUN 19 07/02/2019 0000   CREATININE 1.3 07/02/2019 0000   CREATININE 1.3 07/02/2019 0000   CREATININE 1.06 06/04/2013 2025   CALCIUM 9.2 07/02/2019 0000   ALBUMIN 4.3 07/02/2019 0000   GFRNONAA 57 07/02/2019 0000   GFRAA 66 07/02/2019 0000     Diabetic Labs (most recent): Lab Results  Component Value Date   HGBA1C 5.4 12/23/2019   HGBA1C 7.2 07/02/2019   HGBA1C 6.8 10/03/2018     Lipid Panel ( most recent) Lipid Panel     Component Value Date/Time   CHOL 173 07/02/2019 0000   TRIG 286 (A) 07/02/2019 0000   HDL 36 07/02/2019 0000   LDLCALC 80 07/02/2019 0000      Assessment & Plan:   1. Type 2 diabetes mellitus with stage 2 chronic kidney disease, without long-term current use of insulin (HCC)  - Deylan Echelbarger Shaddy has currently controlled asymptomatic type 2 DM since  63 years of age. His EAG is 126, a1c in office is 5.4% from  7.2 %. Recent labs reviewed. - I had a long discussion with him about the progressive nature of diabetes and the pathology behind its complications. -He does has mild microalbuminuria, no gross complications , however, he remains at a high risk for more acute and chronic complications which include CAD, CVA, CKD, retinopathy, and neuropathy. These are all discussed in detail with him.  - I have counseled him on diet  and weight management  by adopting a carbohydrate restricted/protein rich diet. Patient is encouraged to switch to  unprocessed or minimally processed     complex starch and increased protein intake (animal or plant source), fruits, and vegetables. -  he is advised to stick to a routine mealtimes to eat 3 meals  a  day and avoid unnecessary  snacks ( to snack only to correct hypoglycemia).   - he  admits there is a room for improvement in his diet and drink choices. -  Suggestion is made for him to avoid simple carbohydrates  from his diet including Cakes, Sweet Desserts / Pastries, Ice Cream, Soda (diet and regular), Sweet Tea, Candies, Chips, Cookies, Sweet Pastries,  Store Bought Juices, Alcohol in Excess of  1-2 drinks a day, Artificial Sweeteners, Coffee Creamer, and "Sugar-free" Products. This will help patient to have stable blood glucose profile and potentially avoid unintended weight gain.   - he has been  scheduled with Jearld Fenton, RDN, CDE for diabetes education.  - I have approached him with the following individualized plan to manage  his diabetes and patient agrees:   -He is advised to lower his Metformin to 500 mg p.o. daily only after breakfast.   -He will not monitor blood glucose regularly.  He has lost approximately 25 pounds over the last 6 months.  Most of this is intentional by changing his diet.  -He has several options if he loses control of diabetes during his next visit, including glipizide therapy, insulin, incretin therapy as appropriate. - Specific targets for  A1c;  LDL, HDL, Triglycerides, and  Waist Circumference were discussed with the patient.  2) Blood Pressure /Hypertension: His blood pressure is controlled to target.  He is not on any antihypertensive medications.  3) Lipids/Hyperlipidemia:   Review of his recent lipid panel showed controlled  LDL at 80 .  he  is advised to continue simvastatin 20 mg p.o. daily at bedtime.  Side effects and precautions discussed with him.  4)  Weight/Diet:  Body mass index is 30.96 kg/m.  -   clearly complicating his diabetes care.   he is  a candidate for weight loss. I discussed with him the fact that loss of 5 - 10% of his  current body weight will have the most impact on his diabetes management.  Exercise, and detailed carbohydrates information provided   -  detailed on discharge instructions.  5) Chronic Care/Health Maintenance:  -he  is on Statin medications and  is encouraged to initiate and continue to follow up with Ophthalmology, Dentist,  Podiatrist at least yearly or according to recommendations, and advised to   stay away from smoking. I have recommended yearly flu vaccine and pneumonia vaccine at least every 5 years; moderate intensity exercise for up to 150 minutes weekly; and  sleep for at least 7 hours a day.  - he is  advised to maintain close follow up with Sinda Du, MD for primary care needs, as well as his other providers for optimal and coordinated care.  - Time spent with the patient: 25 min, of which >50% was spent in reviewing his blood glucose logs , discussing his hypoglycemia and hyperglycemia episodes, reviewing his current and  previous labs / studies and medications  doses and developing a plan to avoid hypoglycemia and hyperglycemia. Please refer to Patient Instructions for Blood Glucose Monitoring and Insulin/Medications Dosing Guide"  in media tab for additional information. Please  also refer to " Patient Self Inventory" in the Media  tab for reviewed elements of pertinent patient history.  [name] participated in the discussions, expressed understanding, and voiced agreement with the above plans.  All questions were answered to his satisfaction. he is encouraged to contact clinic should he have any questions or concerns prior to his return visit.  Follow  up plan: - Return in about 6 months (around 06/22/2020) for Follow up with Pre-visit Labs, Next Visit A1c in Office.  Glade Lloyd, MD [facility] [address] [place], [place] [address] Phone: [phone]  Fax: [phone]    12/23/2019, 1:28 PM  This note was partially dictated with voice recognition software. Similar sounding words can be transcribed inadequately or may not  be corrected upon  review.

## 2019-12-23 NOTE — Patient Instructions (Signed)

## 2019-12-28 DIAGNOSIS — G4733 Obstructive sleep apnea (adult) (pediatric): Secondary | ICD-10-CM | POA: Diagnosis not present

## 2020-02-12 ENCOUNTER — Ambulatory Visit: Payer: Medicare HMO | Admitting: Family Medicine

## 2020-02-19 ENCOUNTER — Telehealth: Payer: Self-pay

## 2020-02-19 NOTE — Telephone Encounter (Signed)
LMOM that Dr Holly Bodily is leaving [place] and Cherly Beach NP at [facility] will be happy to take him as a new patient. Left our phone #.

## 2020-03-03 ENCOUNTER — Encounter: Payer: Self-pay | Admitting: Family Medicine

## 2020-03-03 ENCOUNTER — Other Ambulatory Visit: Payer: Self-pay | Admitting: Family Medicine

## 2020-03-03 ENCOUNTER — Other Ambulatory Visit: Payer: Self-pay

## 2020-03-03 ENCOUNTER — Ambulatory Visit (INDEPENDENT_AMBULATORY_CARE_PROVIDER_SITE_OTHER): Payer: Medicare HMO | Admitting: Family Medicine

## 2020-03-03 VITALS — BP 118/78 | HR 79 | Temp 98.5°F | Resp 16 | Ht 70.75 in | Wt 224.0 lb

## 2020-03-03 DIAGNOSIS — E782 Mixed hyperlipidemia: Secondary | ICD-10-CM

## 2020-03-03 DIAGNOSIS — I714 Abdominal aortic aneurysm, without rupture, unspecified: Secondary | ICD-10-CM

## 2020-03-03 DIAGNOSIS — E1122 Type 2 diabetes mellitus with diabetic chronic kidney disease: Secondary | ICD-10-CM | POA: Diagnosis not present

## 2020-03-03 DIAGNOSIS — K219 Gastro-esophageal reflux disease without esophagitis: Secondary | ICD-10-CM | POA: Diagnosis not present

## 2020-03-03 DIAGNOSIS — N182 Chronic kidney disease, stage 2 (mild): Secondary | ICD-10-CM

## 2020-03-03 DIAGNOSIS — E669 Obesity, unspecified: Secondary | ICD-10-CM

## 2020-03-03 DIAGNOSIS — N400 Enlarged prostate without lower urinary tract symptoms: Secondary | ICD-10-CM | POA: Insufficient documentation

## 2020-03-03 NOTE — Progress Notes (Signed)
Subjective:  Patient ID: Andres Robinson, male    DOB: [birth year]  Age: 64 y.o. MRN: [id number]  CC:  Chief Complaint  Patient presents with  . Establish Care      HPI  HPI  Mr. Delashmutt is a 64 year old male patient of Dr. Luan Pulling who presents today for establishment of care post his retirement.  He has a history that includes a AAA without rupture has been followed by Dr. Kellie Simmering before several years ago has not had a recheck of this.  Is due for colonoscopy.  Has had GERD but is controlled now with diet.  Type 2 diabetes which is starting to be controlled with diet.  Does see Dr. Dorris Fetch.  BPH is on Flomax.  Takes Zocor for his cholesterol.  And has been working on his weight since last year. He has a tendency to have tick bites as he is outside a lot he has been diagnosed with [facility] spotted fever twice.  Significant family history of heart disease, hyperlipidemia, hypertension, diabetes.  Former smoker stopped in 1982.  Drinks about 2 to 3 glasses of wine a week.  No drug use. Retired Curator.  Lives with wife Carolynn Serve.  They have 1 pet Ladell Heads who is a [place] terrier who is definitely has 1 eye.  He drinks caffeine daily.  Reports he drinks water but does not get enough water.  Reports that he can feel a bit better about his diet but they have been working on her diet for the last year.  He has no concerns today to discuss in the office.  Today patient denies signs and symptoms of COVID 19 infection including fever, chills, cough, shortness of breath, and headache. Past Medical, Surgical, Social History, Allergies, and Medications have been Reviewed.   Past Medical History:  Diagnosis Date  . DDD (degenerative disc disease), cervical   . DDD (degenerative disc disease), lumbar   . High cholesterol   . Normal cardiac stress test 2005  . RMSF [facility] spotted fever) 2013  . Type 2 diabetes mellitus with stage 2 chronic kidney disease, without long-term  current use of insulin ([place]) 09/16/2019    Current Meds  Medication Sig  . metFORMIN (GLUCOPHAGE) 500 MG tablet Take 500 mg by mouth daily with breakfast.  . simvastatin (ZOCOR) 20 MG tablet   . tamsulosin (FLOMAX) 0.4 MG CAPS capsule Take 0.4 mg by mouth 2 (two) times daily.  . [DISCONTINUED] simvastatin (ZOCOR) 20 MG tablet Take 20 mg by mouth daily.    ROS:  Review of Systems  Constitutional: Negative.   HENT: Negative.   Eyes: Negative.   Respiratory: Negative.   Cardiovascular: Negative.   Gastrointestinal: Negative.   Genitourinary: Negative.   Musculoskeletal: Negative.   Skin: Negative.   Neurological: Negative.   Endo/Heme/Allergies: Negative.   Psychiatric/Behavioral: Negative.   All other systems reviewed and are negative.    Objective:   Today's Vitals: BP 118/78   Pulse 79   Temp 98.5 F (36.9 C) (Temporal)   Resp 16   Ht 5' 10.75" (1.797 m)   Wt 224 lb (101.6 kg)   SpO2 96%   BMI 31.46 kg/m  Vitals with BMI 03/03/2020 12/23/2019 12/23/2019  Height 5' 10.75" 5\' 11"  5\' 11"   Weight 224 lbs 222 lbs 222 lbs  BMI 31.46 [id number]  Systolic 123456 - [id number]  Diastolic 78 - 74  Pulse 79 - 70     Physical Exam Vitals  and nursing note reviewed.  Constitutional:      Appearance: Normal appearance. He is well-developed and well-groomed. He is obese.  HENT:     Head: Normocephalic and atraumatic.     Right Ear: External ear normal.     Left Ear: External ear normal.     Mouth/Throat:     Comments: Mask in place  Glasses Eyes:     General:        Right eye: No discharge.        Left eye: No discharge.     Conjunctiva/sclera: Conjunctivae normal.  Cardiovascular:     Rate and Rhythm: Normal rate and regular rhythm.     Pulses: Normal pulses.     Heart sounds: Normal heart sounds.  Pulmonary:     Effort: Pulmonary effort is normal.     Breath sounds: Normal breath sounds.  Musculoskeletal:        General: Normal range of motion.     Cervical back:  Normal range of motion and neck supple.  Skin:    General: Skin is warm.  Neurological:     General: No focal deficit present.     Mental Status: He is alert and oriented to person, place, and time.  Psychiatric:        Attention and Perception: Attention normal.        Mood and Affect: Mood normal.        Speech: Speech normal.        Behavior: Behavior normal. Behavior is cooperative.        Thought Content: Thought content normal.        Cognition and Memory: Cognition normal.        Judgment: Judgment normal.     Assessment   1. Type 2 diabetes mellitus with stage 2 chronic kidney disease, without long-term current use of insulin ([place])   2. AAA (abdominal aortic aneurysm) without rupture ([place])   3. Gastroesophageal reflux disease without esophagitis   4. Mixed hyperlipidemia   5. Benign prostatic hyperplasia, unspecified whether lower urinary tract symptoms present   6. Obesity (BMI 30-39.9)     Tests ordered Orders Placed This Encounter  Procedures  . COMPLETE METABOLIC PANEL WITH GFR  . CBC  . Lipid panel  . Ambulatory referral to Gastroenterology     Plan: Please see assessment and plan per problem list above.   No orders of the defined types were placed in this encounter.   Patient to follow-up in 6 months for annual   Perlie Mayo, NP

## 2020-03-03 NOTE — Assessment & Plan Note (Signed)
Improved followed by Dr Dorris Fetch

## 2020-03-03 NOTE — Assessment & Plan Note (Signed)
Has not been seen in years.  Was told it was stable.

## 2020-03-03 NOTE — Progress Notes (Signed)
US

## 2020-03-03 NOTE — Assessment & Plan Note (Addendum)
On flomax, controlled

## 2020-03-03 NOTE — Assessment & Plan Note (Signed)
  Andres Robinson is educated about the importance of exercise daily to help with weight management. A minumum of 30 minutes daily is recommended. Additionally, importance of healthy food choices  with portion control discussed.   Wt Readings from Last 3 Encounters:  03/03/20 224 lb (101.6 kg)  12/23/19 222 lb (100.7 kg)  12/23/19 222 lb (100.7 kg)

## 2020-03-03 NOTE — Addendum Note (Signed)
Addended by: Eual Fines on: 03/03/2020 04:40 PM   Modules accepted: Orders

## 2020-03-03 NOTE — Assessment & Plan Note (Signed)
Needs updated labs.

## 2020-03-03 NOTE — Assessment & Plan Note (Signed)
No medication, diet controlled

## 2020-03-03 NOTE — Patient Instructions (Addendum)
I appreciate the opportunity to provide you with care for your health and wellness. Today we discussed: establish care  Follow up: 6 months for annual   Updated labs ordered, please get fasting in the next week  Referral for Colonoscopy made today  Please continue to practice social distancing to keep you, your family, and our community safe.  If you must go out, please wear a mask and practice good handwashing.  It was a pleasure to see you and I look forward to continuing to work together on your health and well-being. Please do not hesitate to call the office if you need care or have questions about your care.  Have a wonderful day and week. With Gratitude, Cherly Beach, DNP, AGNP-BC

## 2020-03-04 ENCOUNTER — Encounter (INDEPENDENT_AMBULATORY_CARE_PROVIDER_SITE_OTHER): Payer: Self-pay | Admitting: *Deleted

## 2020-03-10 ENCOUNTER — Other Ambulatory Visit: Payer: Self-pay

## 2020-03-10 ENCOUNTER — Ambulatory Visit (HOSPITAL_COMMUNITY)
Admission: RE | Admit: 2020-03-10 | Discharge: 2020-03-10 | Disposition: A | Discharge status: Home / Self Care | Payer: Medicare HMO | Source: Ambulatory Visit | Attending: Family Medicine | Admitting: Family Medicine

## 2020-03-10 DIAGNOSIS — I714 Abdominal aortic aneurysm, without rupture, unspecified: Secondary | ICD-10-CM

## 2020-03-11 LAB — COMPLETE METABOLIC PANEL WITH GFR
AG Ratio: 1.5 (calc) (ref 1.0–2.5)
ALT: 22 U/L (ref 9–46)
AST: 22 U/L (ref 10–35)
Albumin: 4.5 g/dL (ref 3.6–5.1)
Alkaline phosphatase (APISO): 53 U/L (ref 35–144)
BUN: 23 mg/dL (ref 7–25)
CO2: 28 mmol/L (ref 20–32)
Calcium: 9.8 mg/dL (ref 8.6–10.3)
Chloride: 107 mmol/L (ref 98–110)
Creat: 1.15 mg/dL (ref 0.70–1.25)
GFR, Est African American: 76 mL/min/{1.73_m2} (ref >=60)
GFR, Est Non African American: 66 mL/min/{1.73_m2} (ref >=60)
Globulin: 3.1 g/dL (calc) (ref 1.9–3.7)
Glucose, Bld: 121 mg/dL — ABNORMAL HIGH (ref 65–99)
Potassium: 4.9 mmol/L (ref 3.5–5.3)
Sodium: 141 mmol/L (ref 135–146)
Total Bilirubin: 0.7 mg/dL (ref 0.2–1.2)
Total Protein: 7.6 g/dL (ref 6.1–8.1)

## 2020-03-11 LAB — CBC
HCT: 45.3 % (ref 38.5–50.0)
Hemoglobin: 15.3 g/dL (ref 13.2–17.1)
MCH: 31.4 pg (ref 27.0–33.0)
MCHC: 33.8 g/dL (ref 32.0–36.0)
MCV: 92.8 fL (ref 80.0–100.0)
MPV: 9.4 fL (ref 7.5–12.5)
Platelets: 189 10*3/uL (ref 140–400)
RBC: 4.88 10*6/uL (ref 4.20–5.80)
RDW: 12.7 % (ref 11.0–15.0)
WBC: 3.9 10*3/uL (ref 3.8–10.8)

## 2020-03-11 LAB — LIPID PANEL
Cholesterol: 119 mg/dL (ref <200)
HDL: 42 mg/dL (ref >=40)
LDL Cholesterol (Calc): 55 mg/dL (calc)
Non-HDL Cholesterol (Calc): 77 mg/dL (calc) (ref <130)
Total CHOL/HDL Ratio: 2.8 (calc) (ref <5.0)
Triglycerides: 136 mg/dL (ref <150)

## 2020-03-24 ENCOUNTER — Telehealth: Payer: Medicare HMO | Admitting: Family

## 2020-03-24 DIAGNOSIS — J069 Acute upper respiratory infection, unspecified: Secondary | ICD-10-CM | POA: Diagnosis not present

## 2020-03-24 MED ORDER — DOXYCYCLINE HYCLATE 100 MG PO TABS: 100.0000 mg | ORAL_TABLET | Freq: Two times a day (BID) | ORAL | 0 refills | Status: DC

## 2020-03-24 NOTE — Addendum Note (Signed)
Addended by: Evelina Dun A on: 03/24/2020 11:30 AM   Modules accepted: Orders

## 2020-03-24 NOTE — Progress Notes (Signed)
We are sorry you are not feeling well.  Here is how we plan to help!  Based on what you have shared with me, it looks like you may have a viral upper respiratory infection.  Upper respiratory infections are caused by a large number of viruses; however, rhinovirus is the most common cause.   Symptoms vary from person to person, with common symptoms including sore throat, cough, fatigue or lack of energy and feeling of general discomfort.  A low-grade fever of up to 100.4 may present, but is often uncommon.  Symptoms vary however, and are closely related to a person's age or underlying illnesses.  The most common symptoms associated with an upper respiratory infection are nasal discharge or congestion, cough, sneezing, headache and pressure in the ears and face.  These symptoms usually persist for about 3 to 10 days, but can last up to 2 weeks.  It is important to know that upper respiratory infections do not cause serious illness or complications in most cases.    Upper respiratory infections can be transmitted from person to person, with the most common method of transmission being a person's hands.  The virus is able to live on the skin and can infect other persons for up to 2 hours after direct contact.  Also, these can be transmitted when someone coughs or sneezes; thus, it is important to cover the mouth to reduce this risk.  To keep the spread of the illness at [facility], good hand hygiene is very important.  This is an infection that is most likely caused by a virus. There are no specific treatments other than to help you with the symptoms until the infection runs its course.  We are sorry you are not feeling well.  Here is how we plan to help!   For nasal congestion, you may use an oral decongestants such as Mucinex D or if you have glaucoma or high blood pressure use plain Mucinex.  Saline nasal spray or nasal drops can help and can safely be used as often as needed for congestion.  For your congestion,  continue your Fluticasone nasal spray one spray in each nostril twice a day.  We usually do not treat with antibiotics for sinus infections until 7-10 days. Please let us know if your symptoms worsen or do not improve.   If you do not have a history of heart disease, hypertension, diabetes or thyroid disease, prostate/bladder issues or glaucoma, you may also use Sudafed to treat nasal congestion.  It is highly recommended that you consult with a pharmacist or your primary care physician to ensure this medication is safe for you to take.     If you have a cough, you may use cough suppressants such as Delsym and Robitussin.  If you have glaucoma or high blood pressure, you can also use Coricidin HBP.     If you have a sore or scratchy throat, use a saltwater gargle-  to  teaspoon of salt dissolved in a 4-ounce to 8-ounce glass of warm water.  Gargle the solution for approximately 15-30 seconds and then spit.  It is important not to swallow the solution.  You can also use throat lozenges/cough drops and Chloraseptic spray to help with throat pain or discomfort.  Warm or cold liquids can also be helpful in relieving throat pain.  For headache, pain or general discomfort, you can use Ibuprofen or Tylenol as directed.   Some authorities believe that zinc sprays or the use of Echinacea  may shorten the course of your symptoms.   HOME CARE . Only take medications as instructed by your medical team. . Be sure to drink plenty of fluids. Water is fine as well as fruit juices, sodas and electrolyte beverages. You may want to stay away from caffeine or alcohol. If you are nauseated, try taking small sips of liquids. How do you know if you are getting enough fluid? Your urine should be a pale yellow or almost colorless. . Get rest. . Taking a steamy shower or using a humidifier may help nasal congestion and ease sore throat pain. You can place a towel over your head and breathe in the steam from hot water  coming from a faucet. . Using a saline nasal spray works much the same way. . Cough drops, hard candies and sore throat lozenges may ease your cough. . Avoid close contacts especially the very young and the elderly . Cover your mouth if you cough or sneeze . Always remember to wash your hands.   GET HELP RIGHT AWAY IF: . You develop worsening fever. . If your symptoms do not improve within 10 days . You develop yellow or green discharge from your nose over 3 days. . You have coughing fits . You develop a severe head ache or visual changes. . You develop shortness of breath, difficulty breathing or start having chest pain . Your symptoms persist after you have completed your treatment plan  MAKE SURE YOU   Understand these instructions.  Will watch your condition.  Will get help right away if you are not doing well or get worse.  Your e-visit answers were reviewed by a board certified advanced clinical practitioner to complete your personal care plan. Depending upon the condition, your plan could have included both over the counter or prescription medications. Please review your pharmacy choice. If there is a problem, you may call our nursing hot line at and have the prescription routed to another pharmacy. Your safety is important to [place]. If you have drug allergies check your prescription carefully.   You can use MyChart to ask questions about today's visit, request a non-urgent call back, or ask for a work or school excuse for 24 hours related to this e-Visit. If it has been greater than 24 hours you will need to follow up with your provider, or enter a new e-Visit to address those concerns. You will get an e-mail in the next two days asking about your experience.  I hope that your e-visit has been valuable and will speed your recovery. Thank you for using e-visits.    Approximately 5 minutes was spent documenting and reviewing patient's chart.

## 2020-03-26 ENCOUNTER — Ambulatory Visit (INDEPENDENT_AMBULATORY_CARE_PROVIDER_SITE_OTHER): Payer: Medicare HMO | Admitting: Family Medicine

## 2020-03-26 ENCOUNTER — Encounter: Payer: Self-pay | Admitting: Family Medicine

## 2020-03-26 ENCOUNTER — Other Ambulatory Visit: Payer: Self-pay

## 2020-03-26 VITALS — BP 118/78 | Ht 70.0 in | Wt 224.0 lb

## 2020-03-26 DIAGNOSIS — B9689 Other specified bacterial agents as the cause of diseases classified elsewhere: Secondary | ICD-10-CM | POA: Diagnosis not present

## 2020-03-26 DIAGNOSIS — J019 Acute sinusitis, unspecified: Secondary | ICD-10-CM

## 2020-03-26 HISTORY — DX: Other specified bacterial agents as the cause of diseases classified elsewhere: B96.89

## 2020-03-26 NOTE — Patient Instructions (Signed)
I appreciate the opportunity to provide you with care for your health and wellness. Today we discussed: Follow-up for sinus infection  Follow up: As scheduled  No labs or referrals today  Please continue to recommended treatment based off of your telemedicine visit.  If you have any issues or concerns or worsening symptoms please do not hesitate to reach out.  Over the weekend please proceed to your nearest urgent care or emergency room if you develop trouble breathing or worsening symptoms.  Please continue to practice social distancing to keep you, your family, and our community safe.  If you must go out, please wear a mask and practice good handwashing.  It was a pleasure to see you and I look forward to continuing to work together on your health and well-being. Please do not hesitate to call the office if you need care or have questions about your care.  Have a wonderful day and week. With Gratitude, Cherly Beach, DNP, AGNP-BC

## 2020-03-26 NOTE — Progress Notes (Signed)
Virtual Visit via Telephone Note   This visit type was conducted due to national recommendations for restrictions regarding the COVID-19 Pandemic (e.g. social distancing) in an effort to limit this patient's exposure and mitigate transmission in our community.  Due to his co-morbid illnesses, this patient is at least at moderate risk for complications without adequate follow up.  This format is felt to be most appropriate for this patient at this time.  The patient did not have access to video technology/had technical difficulties with video requiring transitioning to audio format only (telephone).  All issues noted in this document were discussed and addressed.  No physical exam could be performed with this format.   Evaluation Performed:  Follow-up visit  Date:  03/26/2020   ID:  Andres Robinson, Andres Robinson [birth year], MRN [id number]  Patient Location: Home Provider Location: Office  Location of Patient: Home Location of Provider: Telehealth Consent was obtain for visit to be over via telehealth. I verified that I am speaking with the correct person using two identifiers.  PCP:  Perlie Mayo, NP   Chief Complaint: Sinus congestion  History of Present Illness:    Andres Robinson is a 64 y.o. male with history of high cholesterol, obesity, frequent [place] spotted fever, diabetes among others.  Presents today after having sinus congestion for several weeks.  He has now developed upper jaw and teeth pain with chewing, has some mild congestion, denies having any discolored drainage at this time mostly clear white.  Denies having any fevers or chills.  Has been trying over-the-counter Mucinex with some relief but it is short-lived.  He went to the dentist to see if there is anything wrong because of his teeth pain they reported that he most likely had a sinus infection trying to start and continue over-the-counter treatments if it would help prevent it.  He had a virtual visit on March 31 with  [facility] was prescribed doxycycline, for acute bacterial sinusitis.  However he never received a call from his pharmacy to come pick it up.  So reported today to see if he can get something sent in as he is just starting to feel more miserable.  The patient does not have symptoms concerning for COVID-19 infection (fever, chills, cough, or new shortness of breath).   Past Medical, Surgical, Social History, Allergies, and Medications have been Reviewed.  Past Medical History:  Diagnosis Date  . DDD (degenerative disc disease), cervical   . DDD (degenerative disc disease), lumbar   . High cholesterol   . Normal cardiac stress test 2005  . [facility] spotted fever) 2013  . Type 2 diabetes mellitus with stage 2 chronic kidney disease, without long-term current use of insulin ([place]) 09/16/2019   Past Surgical History:  Procedure Laterality Date  . CATARACT EXTRACTION Bilateral   . COLONOSCOPY    . ESOPHAGOGASTRODUODENOSCOPY N/A 01/14/2014   Procedure: ESOPHAGOGASTRODUODENOSCOPY (EGD);  Surgeon: Rogene Houston, MD;  Location: [facility];  Service: Endoscopy;  Laterality: N/A;  320     Current Meds  Medication Sig  . doxycycline (VIBRA-TABS) 100 MG tablet Take 1 tablet (100 mg total) by mouth 2 (two) times daily.  . metFORMIN (GLUCOPHAGE) 500 MG tablet Take 500 mg by mouth daily with breakfast.  . ONETOUCH ULTRA test strip CHECK BLOOD SUGAR ONCE ANDAY  . simvastatin (ZOCOR) 20 MG tablet   . tamsulosin (FLOMAX) 0.4 MG CAPS capsule Take 0.4 mg by mouth 2 (two) times  daily.     Allergies:   Penicillins   ROS:   Please see the history of present illness.    All other systems reviewed and are negative.   Labs/Other Tests and Data Reviewed:    Recent Labs: 03/10/2020: ALT 22; BUN 23; Creat 1.15; Hemoglobin 15.3; Platelets 189; Potassium 4.9; Sodium 141   Recent Lipid Panel Lab Results  Component Value Date/Time   CHOL 119 03/10/2020 09:43 AM   TRIG 136 03/10/2020 09:43  AM   HDL 42 03/10/2020 09:43 AM   CHOLHDL 2.8 03/10/2020 09:43 AM   LDLCALC 55 03/10/2020 09:43 AM    Wt Readings from Last 3 Encounters:  03/26/20 224 lb (101.6 kg)  03/03/20 224 lb (101.6 kg)  12/23/19 222 lb (100.7 kg)     Objective:    Vital Signs:  BP 118/78   Ht 5\' 10"  (1.778 m)   Wt 224 lb (101.6 kg)   BMI 32.14 kg/m    VITAL SIGNS:  reviewed GEN:  Alert and oriented RESPIRATORY:  No shortness of breath noted in conversation PSYCH:  Normal affect and mood  ASSESSMENT & PLAN:    1. Acute bacterial sinusitis   Time:   Today, I have spent 10 minutes with the patient with telehealth technology discussing the above problems.     Medication Adjustments/Labs and Tests Ordered: Current medicines are reviewed at length with the patient today.  Concerns regarding medicines are outlined above.   Tests Ordered: No orders of the defined types were placed in this encounter.   Medication Changes: No orders of the defined types were placed in this encounter.   Disposition:  Follow up as scheduled Signed, Perlie Mayo, NP  03/26/2020 9:06 AM     [facility]

## 2020-03-26 NOTE — Assessment & Plan Note (Signed)
Continued with prescribed treatment from virtual telemedicine visit.  If unable to feel relief of symptoms please notify the clinic.  If you develop trouble with breathing or worsening symptoms over the weekend please proceed to your nearest emergency and or urgent care.  Reviewed side effects, risks and benefits of medication.   Patient acknowledged agreement and understanding of the plan.

## 2020-04-06 ENCOUNTER — Other Ambulatory Visit: Payer: Self-pay

## 2020-04-06 MED ORDER — METFORMIN HCL 500 MG PO TABS: 500.0000 mg | ORAL_TABLET | Freq: Every day | ORAL | 0 refills | Status: DC

## 2020-04-20 ENCOUNTER — Encounter: Payer: Self-pay | Admitting: Family Medicine

## 2020-04-21 ENCOUNTER — Other Ambulatory Visit: Payer: Self-pay | Admitting: Family Medicine

## 2020-04-21 DIAGNOSIS — N400 Enlarged prostate without lower urinary tract symptoms: Secondary | ICD-10-CM

## 2020-04-21 DIAGNOSIS — N401 Enlarged prostate with lower urinary tract symptoms: Secondary | ICD-10-CM

## 2020-04-21 MED ORDER — TAMSULOSIN HCL 0.4 MG PO CAPS: 0.4000 mg | ORAL_CAPSULE | Freq: Two times a day (BID) | ORAL | 0 refills | Status: DC

## 2020-05-03 ENCOUNTER — Encounter: Payer: Self-pay | Admitting: Family Medicine

## 2020-05-03 ENCOUNTER — Other Ambulatory Visit: Payer: Self-pay | Admitting: *Deleted

## 2020-05-03 MED ORDER — SIMVASTATIN 20 MG PO TABS: 20.0000 mg | ORAL_TABLET | Freq: Every day | ORAL | 3 refills | Status: DC

## 2020-06-15 ENCOUNTER — Other Ambulatory Visit: Payer: Self-pay

## 2020-06-15 DIAGNOSIS — N182 Chronic kidney disease, stage 2 (mild): Secondary | ICD-10-CM

## 2020-06-15 DIAGNOSIS — E782 Mixed hyperlipidemia: Secondary | ICD-10-CM

## 2020-06-17 LAB — COMPREHENSIVE METABOLIC PANEL
AG Ratio: 1.6 (calc) (ref 1.0–2.5)
ALT: 19 U/L (ref 9–46)
AST: 17 U/L (ref 10–35)
Albumin: 4.1 g/dL (ref 3.6–5.1)
Alkaline phosphatase (APISO): 52 U/L (ref 35–144)
BUN/Creatinine Ratio: 25 (calc) — ABNORMAL HIGH (ref 6–22)
BUN: 27 mg/dL — ABNORMAL HIGH (ref 7–25)
CO2: 27 mmol/L (ref 20–32)
Calcium: 9.1 mg/dL (ref 8.6–10.3)
Chloride: 105 mmol/L (ref 98–110)
Creat: 1.08 mg/dL (ref 0.70–1.25)
Globulin: 2.6 g/dL (calc) (ref 1.9–3.7)
Glucose, Bld: 123 mg/dL — ABNORMAL HIGH (ref 65–99)
Potassium: 4.2 mmol/L (ref 3.5–5.3)
Sodium: 139 mmol/L (ref 135–146)
Total Bilirubin: 0.6 mg/dL (ref 0.2–1.2)
Total Protein: 6.7 g/dL (ref 6.1–8.1)

## 2020-06-17 LAB — T4, FREE: Free T4: 1 ng/dL (ref 0.8–1.8)

## 2020-06-17 LAB — TSH: TSH: 2.57 mIU/L (ref 0.40–4.50)

## 2020-06-22 ENCOUNTER — Encounter: Payer: Self-pay | Admitting: Nutrition

## 2020-06-22 ENCOUNTER — Encounter: Payer: Self-pay | Admitting: "Endocrinology

## 2020-06-22 ENCOUNTER — Encounter: Payer: Medicare HMO | Attending: "Endocrinology | Admitting: Nutrition

## 2020-06-22 ENCOUNTER — Other Ambulatory Visit: Payer: Self-pay

## 2020-06-22 ENCOUNTER — Ambulatory Visit (INDEPENDENT_AMBULATORY_CARE_PROVIDER_SITE_OTHER): Payer: Medicare HMO | Admitting: "Endocrinology

## 2020-06-22 VITALS — Ht 71.0 in | Wt 217.0 lb

## 2020-06-22 VITALS — BP 134/84 | HR 62 | Ht 70.0 in | Wt 217.8 lb

## 2020-06-22 DIAGNOSIS — N182 Chronic kidney disease, stage 2 (mild): Secondary | ICD-10-CM

## 2020-06-22 DIAGNOSIS — E782 Mixed hyperlipidemia: Secondary | ICD-10-CM | POA: Diagnosis not present

## 2020-06-22 DIAGNOSIS — E1122 Type 2 diabetes mellitus with diabetic chronic kidney disease: Secondary | ICD-10-CM

## 2020-06-22 DIAGNOSIS — K219 Gastro-esophageal reflux disease without esophagitis: Secondary | ICD-10-CM

## 2020-06-22 DIAGNOSIS — E669 Obesity, unspecified: Secondary | ICD-10-CM | POA: Insufficient documentation

## 2020-06-22 LAB — POCT GLYCOSYLATED HEMOGLOBIN (HGB A1C): Hemoglobin A1C: 5.6 % (ref 4.0–5.6)

## 2020-06-22 MED ORDER — METFORMIN HCL 500 MG PO TABS: 500.0000 mg | ORAL_TABLET | Freq: Every day | ORAL | 1 refills | Status: DC

## 2020-06-22 MED ORDER — ONETOUCH ULTRA VI STRP: ORAL_STRIP | 2 refills | Status: DC

## 2020-06-22 NOTE — Progress Notes (Signed)
°  Medical Nutrition Therapy:  Appt start time: 1400  end time:  1430   Assessment:  Primary concerns today: Diabetes Type 2. Sees Dr. Dorris Fetch. Saw him today. His wife is with him today.  A1C 5.6%. Watching what he eats. Stays active. Eating more vegetables out of the garden. Works on a farm. Rides bike at times. Drinking water. Avoiding snacks. BS usually less than 130's in am.  Metformin 500 mg once a day. Lost 7 lbs in the last few months.  Lab Results  Component Value Date   HGBA1C 5.6 06/22/2020   CMP Latest Ref Rng & Units 06/16/2020 03/10/2020 07/02/2019  Glucose 65 - 99 mg/dL 123(H) 121(H) -  BUN 7 - 25 mg/dL 27(H) 23 19  Creatinine 0.70 - 1.25 mg/dL 1.08 1.15 1.3  Sodium 135 - 146 mmol/L 139 141 138  Potassium 3.5 - 5.3 mmol/L 4.2 4.9 4.6  Chloride 98 - 110 mmol/L 105 107 103  CO2 20 - 32 mmol/L 27 28 22   Calcium 8.6 - 10.3 mg/dL 9.1 9.8 -  Total Protein 6.1 - 8.1 g/dL 6.7 7.6 -  Total Bilirubin 0.2 - 1.2 mg/dL 0.6 0.7 -  AST 10 - 35 U/L 17 22 -  ALT 9 - 46 U/L 19 22 -     Preferred Learning Style:    No preference indicated    Learning Readiness:    Ready  Change in progress   MEDICATIONS:    DIETARY INTAKE:   B) [place]  toast and boiled toast or eggs and toast, 4 oz  Milk and coffe L) tuna salad with cantelope and crackers, water D) hot dogs  With buns 2,  [place]elope,  Zucchini,  Usual physical activity: walks some.   Estimated energy needs: 1800  calories 200 g carbohydrates 135  g protein 50 g fat  Progress Towards Goal(s):  In progress.   Nutritional Diagnosis:  NB-1.1 Food and nutrition-related knowledge deficit As related to Diabetes.  As evidenced by A1C 7.2%.    Intervention: Nutrition and Diabetes education provided on My Plate, CHO counting, meal planning, portion sizes, timing of meals, avoiding snacks between meals unless having a low blood sugar, target ranges for A1C and blood sugars, signs/symptoms and treatment of hyper/hypoglycemia,  monitoring blood sugars, taking medications as prescribed, benefits of exercising 30 minutes per day and prevention of complications of DM. Marland KitchenFollow up 3 months  Goals Keep up the great job! Keep drinking water Eat meals on time. Keep A1C 5.7% OR below. Lose 2-3 lbs per month-Goal wt 210-215 lbs.  Teaching Method Utilized: Visual Auditory Hands on  Handouts given during visit include:  The Plate Method   Diabetes Instructions.   Barriers to learning/adherence to lifestyle change: none  Demonstrated degree of understanding via:  Teach Back   Monitoring/Evaluation:  Dietary intake, exercise, , and body weight in 3 month(s).

## 2020-06-22 NOTE — Progress Notes (Signed)
06/22/2020, 4:56 PM  Endocrinology follow-up note   Subjective:    Patient ID: Andres Robinson, male    DOB: 07-02-1956.  Andres Robinson is being seen in  Follow up after he was seen in consultation for management of currently uncontrolled symptomatic diabetes requested by  Perlie Mayo, NP.   Past Medical History:  Diagnosis Date  . DDD (degenerative disc disease), cervical   . DDD (degenerative disc disease), lumbar   . High cholesterol   . Normal cardiac stress test 2005  . RMS[facility] spotted fever) 2013  . Type 2 diabetes mellitus with stage 2 chronic kidney disease, without long-term current use of insulin ([place]) 09/16/2019    Past Surgical History:  Procedure Laterality Date  . CATARACT EXTRACTION Bilateral   . COLONOSCOPY    . ESOPHAGOGASTRODUODENOSCOPY N/A 01/14/2014   Procedure: ESOPHAGOGASTRODUODENOSCOPY (EGD);  Surgeon: Rogene Houston, MD;  Location: [facility];  Service: Endoscopy;  Laterality: N/A;  320    Social History   Socioeconomic History  . Marital status: Married    Spouse name: Carolynn Serve   . Number of children: 0  . Years of education: Not on file  . Highest education level: Some college, no degree  Occupational History  . Not on file  Tobacco Use  . Smoking status: Former Research scientist (life sciences)  . Smokeless tobacco: Never Used  . Tobacco comment: quit in 1982  Substance and Sexual Activity  . Alcohol use: Yes    Comment: 2-3 glasses wine a week  . Drug use: No  . Sexual activity: Yes    Partners: Female  Other Topics Concern  . Not on file  Social History Narrative   Lives with Tena married since 1981      Pets: 1 boston terrier -deaf and one eye: Ladell Heads      Enjoys: hunt and being outside      Diet: eat all food groups   Caffeine: 4 cups of coffee   Water: 4 glasses a day      Wears seat belt    Smoke detectors    Does not use phone while driving   Social Determinants of Radio broadcast assistant  Strain: Low Risk   . Difficulty of Paying Living Expenses: Not hard at all  Food Insecurity: No Food Insecurity  . Worried About Charity fundraiser in the Last Year: Never true  . Ran Out of Food in the Last Year: Never true  Transportation Needs: No Transportation Needs  . Lack of Transportation (Medical): No  . Lack of Transportation (Non-Medical): No  Physical Activity: Sufficiently Active  . Days of Exercise per Week: 5 days  . Minutes of Exercise per Session: 30 min  Stress: No Stress Concern Present  . Feeling of Stress : Only a little  Social Connections: Moderately Integrated  . Frequency of Communication with Friends and Family: More than three times a week  . Frequency of Social Gatherings with Friends and Family: More than three times a week  . Attends Religious Services: 1 to 4 times per year  . Active Member of Clubs or Organizations: No  . Attends Archivist Meetings: Never  . Marital Status: Married    Family History  Problem Relation Age of Onset  . Heart disease Father   . Diabetes Father   . Hyperlipidemia Father   . Hypertension Father   . AAA (abdominal aortic aneurysm) Father   .  Peripheral vascular disease Father        amputation  . Hypertension Brother   . Heart attack Brother     Outpatient Encounter Medications as of 06/22/2020  Medication Sig  . metFORMIN (GLUCOPHAGE) 500 MG tablet Take 1 tablet (500 mg total) by mouth daily with breakfast.  . ONETOUCH ULTRA test strip CHECK BLOOD SUGAR ONCE ANDAY  . simvastatin (ZOCOR) 20 MG tablet Take 1 tablet (20 mg total) by mouth daily at 6 PM.  . tamsulosin (FLOMAX) 0.4 MG CAPS capsule Take 1 capsule (0.4 mg total) by mouth 2 (two) times daily.  . [DISCONTINUED] metFORMIN (GLUCOPHAGE) 500 MG tablet Take 1 tablet (500 mg total) by mouth daily with breakfast.  . [DISCONTINUED] ONETOUCH ULTRA test strip CHECK BLOOD SUGAR ONCE ANDAY  . [DISCONTINUED] doxycycline (VIBRA-TABS) 100 MG tablet Take 1  tablet (100 mg total) by mouth 2 (two) times daily.   No facility-administered encounter medications on file as of 06/22/2020.    ALLERGIES: Allergies  Allergen Reactions  . Penicillins Other (See Comments)    Childhood Reaction     VACCINATION STATUS: Immunization History  Administered Date(s) Administered  . Tdap 10/07/2018    Diabetes He presents for his follow-up diabetic visit. He has type 2 diabetes mellitus. Onset time: He was diagnosed at approximate age of 5 years. His disease course has been improving. There are no hypoglycemic associated symptoms. Pertinent negatives for hypoglycemia include no confusion, headaches, pallor or seizures. There are no diabetic associated symptoms. Pertinent negatives for diabetes include no chest pain, no fatigue, no polydipsia, no polyphagia, no polyuria and no weakness. There are no hypoglycemic complications. Symptoms are improving. There are no diabetic complications. Risk factors for coronary artery disease include diabetes mellitus, dyslipidemia, male sex, obesity, tobacco exposure, sedentary lifestyle and family history. Current diabetic treatment includes oral agent (monotherapy). His weight is decreasing steadily. He is following a diabetic diet. He has not had a previous visit with a dietitian. He rarely participates in exercise. There is no change in his home blood glucose trend. His overall blood glucose range is 110-130 mg/dl. (He presents with controlled glycemic profile, and A1c of 5.6%, overall improving from 7.2%.   ) An ACE inhibitor/angiotensin II receptor blocker is not being taken. Eye exam is current.  Hyperlipidemia This is a chronic problem. The current episode started more than 1 year ago. The problem is uncontrolled. Exacerbating diseases include diabetes and obesity. Pertinent negatives include no chest pain, myalgias or shortness of breath. Current antihyperlipidemic treatment includes statins. Risk factors for coronary  artery disease include dyslipidemia, diabetes mellitus, obesity, male sex and a sedentary lifestyle.     Review of Systems  Constitutional: Negative for chills, fatigue, fever and unexpected weight change.  HENT: Negative for dental problem, mouth sores and trouble swallowing.   Eyes: Negative for visual disturbance.  Respiratory: Negative for cough, choking, chest tightness, shortness of breath and wheezing.   Cardiovascular: Negative for chest pain, palpitations and leg swelling.  Gastrointestinal: Negative for abdominal distention, abdominal pain, constipation, diarrhea, nausea and vomiting.  Endocrine: Negative for polydipsia, polyphagia and polyuria.  Genitourinary: Negative for dysuria, flank pain, hematuria and urgency.  Musculoskeletal: Negative for back pain, gait problem, myalgias and neck pain.  Skin: Negative for pallor, rash and wound.  Neurological: Negative for seizures, syncope, weakness, numbness and headaches.  Psychiatric/Behavioral: Negative for confusion and dysphoric mood.    Objective:    BP 134/84 (BP Location: Right Arm, Patient Position: Sitting)   Pulse  62   Ht 5\' 10"  (1.778 m)   Wt 217 lb 12.8 oz (98.8 kg)   BMI 31.25 kg/m   Wt Readings from Last 3 Encounters:  06/22/20 217 lb (98.4 kg)  06/22/20 217 lb 12.8 oz (98.8 kg)  03/26/20 224 lb (101.6 kg)     Physical Exam Constitutional:      General: He is not in acute distress.    Appearance: He is well-developed.  HENT:     Head: Normocephalic and atraumatic.  Neck:     Thyroid: No thyromegaly.     Trachea: No tracheal deviation.  Cardiovascular:     Rate and Rhythm: Normal rate.     Pulses:          Dorsalis pedis pulses are 1+ on the right side and 1+ on the left side.       Posterior tibial pulses are 1+ on the right side and 1+ on the left side.     Heart sounds: S1 normal and S2 normal. No murmur heard.  No gallop.   Pulmonary:     Effort: Pulmonary effort is normal. No respiratory  distress.     Breath sounds: No wheezing.  Abdominal:     General: There is no distension.     Tenderness: There is no abdominal tenderness. There is no guarding.  Musculoskeletal:     Right shoulder: No swelling or deformity.     Cervical back: Normal range of motion and neck supple.  Skin:    General: Skin is warm and dry.     Findings: No rash.     Nails: There is no clubbing.  Neurological:     Mental Status: He is alert and oriented to person, place, and time.     Cranial Nerves: No cranial nerve deficit.     Sensory: No sensory deficit.     Gait: Gait normal.     Deep Tendon Reflexes: Reflexes are normal and symmetric.  Psychiatric:        Speech: Speech normal.        Behavior: Behavior normal. Behavior is cooperative.        Thought Content: Thought content normal.        Judgment: Judgment normal.      CMP     Component Value Date/Time   NA 139 06/16/2020 0731   NA 138 07/02/2019 0000   K 4.2 06/16/2020 0731   CL 105 06/16/2020 0731   CO2 27 06/16/2020 0731   GLUCOSE 123 (H) 06/16/2020 0731   BUN 27 (H) 06/16/2020 0731   BUN 19 07/02/2019 0000   BUN 19 07/02/2019 0000   CREATININE 1.08 06/16/2020 0731   CALCIUM 9.1 06/16/2020 0731   PROT 6.7 06/16/2020 0731   ALBUMIN 4.3 07/02/2019 0000   AST 17 06/16/2020 0731   ALT 19 06/16/2020 0731   BILITOT 0.6 06/16/2020 0731   GFRNONAA 66 03/10/2020 0943   GFRAA 76 03/10/2020 0943     Diabetic Labs (most recent): Lab Results  Component Value Date   HGBA1C 5.6 06/22/2020   HGBA1C 5.4 12/23/2019   HGBA1C 7.2 07/02/2019     Lipid Panel ( most recent) Lipid Panel     Component Value Date/Time   CHOL 119 03/10/2020 0943   TRIG 136 03/10/2020 0943   HDL 42 03/10/2020 0943   CHOLHDL 2.8 03/10/2020 0943   LDLCALC 55 03/10/2020 0943      Assessment & Plan:   1. Type 2 diabetes mellitus with  stage 2 chronic kidney disease, without long-term current use of insulin ([place])  - Andres Robinson has currently  controlled asymptomatic type 2 DM since  64 years of age.  He presents with controlled glycemic profile, and A1c of 5.6%, overall improving from 7.2%.     Recent labs reviewed. - I had a long discussion with him about the progressive nature of diabetes and the pathology behind its complications. -He does has mild microalbuminuria, no gross complications , however, he remains at a high risk for more acute and chronic complications which include CAD, CVA, CKD, retinopathy, and neuropathy. These are all discussed in detail with him.  - I have counseled him on diet  and weight management  by adopting a carbohydrate restricted/protein rich diet. Patient is encouraged to switch to  unprocessed or minimally processed     complex starch and increased protein intake (animal or plant source), fruits, and vegetables. -  he is advised to stick to a routine mealtimes to eat 3 meals  a day and avoid unnecessary snacks ( to snack only to correct hypoglycemia).   - he  admits there is a room for improvement in his diet and drink choices. -  Suggestion is made for him to avoid simple carbohydrates  from his diet including Cakes, Sweet Desserts / Pastries, Ice Cream, Soda (diet and regular), Sweet Tea, Candies, Chips, Cookies, Sweet Pastries,  Store Bought Juices, Alcohol in Excess of  1-2 drinks a day, Artificial Sweeteners, Coffee Creamer, and "Sugar-free" Products. This will help patient to have stable blood glucose profile and potentially avoid unintended weight gain.   - he has been  scheduled with Jearld Fenton, RDN, CDE for diabetes education.  - I have approached him with the following individualized plan to manage  his diabetes and patient agrees:   -He is advised to continue Metformin 500 mg p.o. daily after breakfast.  He will continue to monitor blood glucose before breakfast.    -He is encouraged to call clinic for blood glucose readings above 200 mg per DL x 3 in a  week.   -   He has lost  approximately 30 pounds overall.   Most of this is intentional by changing his diet.  -He has several options if he loses control of diabetes during his next visit, including glipizide therapy, insulin, incretin therapy as appropriate.  - Specific targets for  A1c;  LDL, HDL, Triglycerides,  were discussed with the patient.  2) Blood Pressure /Hypertension: His blood pressure is controlled to target.    He is not on any antihypertensive medications.  3) Lipids/Hyperlipidemia:   Review of his recent lipid panel showed controlled  LDL 55, improving from 80.  He is advised to continue simvastatin 20 mg p.o. daily at bedtime.    -  he  is advised to continue simvastatin 20 mg p.o. daily at bedtime.  Side effects and precautions discussed with him.  4)  Weight/Diet:  Body mass index is 31.25 kg/m.  -   clearly complicating his diabetes care.   he is  a candidate for weight loss. I discussed with him the fact that loss of 5 - 10% of his  current body weight will have the most impact on his diabetes management.  Exercise, and detailed carbohydrates information provided  -  detailed on discharge instructions.  5) Chronic Care/Health Maintenance:  -he  is on Statin medications and  is encouraged to initiate and continue to follow up with Ophthalmology, Dentist,  Podiatrist at least yearly or according to recommendations, and advised to   stay away from smoking. I have recommended yearly flu vaccine and pneumonia vaccine at least every 5 years; moderate intensity exercise for up to 150 minutes weekly; and  sleep for at least 7 hours a day.  - he is  advised to maintain close follow up with Perlie Mayo, NP for primary care needs, as well as his other providers for optimal and coordinated care.  - Time spent on this patient care encounter:  35 min, of which > 50% was spent in  counseling and the rest reviewing his blood glucose logs , discussing his hypoglycemia and hyperglycemia episodes, reviewing  his current and  previous labs / studies  ( including abstraction from other facilities) and medications  doses and developing a  long term treatment plan and documenting his care.   Please refer to Patient Instructions for Blood Glucose Monitoring and Insulin/Medications Dosing Guide"  in media tab for additional information. Please  also refer to " Patient Self Inventory" in the Media  tab for reviewed elements of pertinent patient history.  [name] participated in the discussions, expressed understanding, and voiced agreement with the above plans.  All questions were answered to his satisfaction. he is encouraged to contact clinic should he have any questions or concerns prior to his return visit.   Follow up plan: - Return in about 6 months (around 12/22/2020) for F/U with Pre-visit Labs.  Glade Lloyd, MD [facility] [address] [place], [place] [address] Phone: [phone]  Fax: [phone]    06/22/2020, 4:56 PM  This note was partially dictated with voice recognition software. Similar sounding words can be transcribed inadequately or may not  be corrected upon review.

## 2020-06-22 NOTE — Patient Instructions (Signed)
Goals Keep up the great job~ Maintain blood sugars less than 150 mg/dl. Increase water to gallon

## 2020-06-23 ENCOUNTER — Ambulatory Visit: Payer: Medicare HMO | Admitting: Nutrition

## 2020-06-23 ENCOUNTER — Ambulatory Visit: Payer: Medicare HMO | Admitting: "Endocrinology

## 2020-09-02 ENCOUNTER — Encounter: Payer: Medicare HMO | Admitting: Family Medicine

## 2020-09-08 ENCOUNTER — Other Ambulatory Visit: Payer: Self-pay | Admitting: *Deleted

## 2020-09-08 MED ORDER — SIMVASTATIN 20 MG PO TABS: 20.0000 mg | ORAL_TABLET | Freq: Every day | ORAL | 3 refills | Status: DC

## 2020-09-29 ENCOUNTER — Other Ambulatory Visit: Payer: Self-pay

## 2020-09-29 ENCOUNTER — Ambulatory Visit (INDEPENDENT_AMBULATORY_CARE_PROVIDER_SITE_OTHER): Payer: Medicare HMO | Admitting: Family Medicine

## 2020-09-29 ENCOUNTER — Encounter: Payer: Self-pay | Admitting: Family Medicine

## 2020-09-29 VITALS — BP 130/84 | HR 66 | Resp 16 | Ht 71.0 in | Wt 218.0 lb

## 2020-09-29 DIAGNOSIS — Z0001 Encounter for general adult medical examination with abnormal findings: Secondary | ICD-10-CM | POA: Insufficient documentation

## 2020-09-29 DIAGNOSIS — E782 Mixed hyperlipidemia: Secondary | ICD-10-CM

## 2020-09-29 DIAGNOSIS — E1122 Type 2 diabetes mellitus with diabetic chronic kidney disease: Secondary | ICD-10-CM | POA: Diagnosis not present

## 2020-09-29 DIAGNOSIS — N182 Chronic kidney disease, stage 2 (mild): Secondary | ICD-10-CM

## 2020-09-29 DIAGNOSIS — K219 Gastro-esophageal reflux disease without esophagitis: Secondary | ICD-10-CM

## 2020-09-29 DIAGNOSIS — N400 Enlarged prostate without lower urinary tract symptoms: Secondary | ICD-10-CM

## 2020-09-29 DIAGNOSIS — E669 Obesity, unspecified: Secondary | ICD-10-CM

## 2020-09-29 DIAGNOSIS — Z23 Encounter for immunization: Secondary | ICD-10-CM | POA: Diagnosis not present

## 2020-09-29 NOTE — Patient Instructions (Addendum)
  HAPPY FALL!  I appreciate the opportunity to provide you with care for your health and wellness. Today we discussed: overall health   Follow up: 6 months or as needed  Labs fasting within the next week No referrals today  [place]! Have a great trip! Be safe!  Please continue to practice social distancing to keep you, your family, and our community safe.  If you must go out, please wear a mask and practice good handwashing.  It was a pleasure to see you and I look forward to continuing to work together on your health and well-being. Please do not hesitate to call the office if you need care or have questions about your care.  Have a wonderful day and week. With Gratitude, Cherly Beach, DNP, AGNP-BC  HEALTH MAINTENANCE RECOMMENDATIONS:  It is recommended that you get at least 30 minutes of aerobic exercise at least 5 days/week (for weight loss, you may need as much as 60-90 minutes). This can be any activity that gets your heart rate up. This can be divided in 10-15 minute intervals if needed, but try and build up your endurance at least once a week.  Weight bearing exercise is also recommended twice weekly.  Eat a healthy diet with lots of vegetables, fruits and fiber.  "Colorful" foods have a lot of vitamins (ie green vegetables, tomatoes, red peppers, etc).  Limit sweet tea, regular sodas and alcoholic beverages, all of which has a lot of calories and sugar.  Up to 2 alcoholic drinks daily may be beneficial for men (unless trying to lose weight, watch sugars).  Drink a lot of water.  Sunscreen of at least SPF 30 should be used on all sun-exposed parts of the skin when outside between the hours of 10 am and 4 pm (not just when at beach or pool, but even with exercise, golf, tennis, and yard work!)  Use a sunscreen that says "broad spectrum" so it covers both UVA and UVB rays, and make sure to reapply every 1-2 hours.  Remember to change the batteries in your smoke  detectors when changing your clock times in the spring and fall.  Use your seat belt every time you are in a car, and please drive safely and not be distracted with cell phones and texting while driving.

## 2020-09-29 NOTE — Assessment & Plan Note (Signed)
Diet controlled- no symptoms

## 2020-09-29 NOTE — Assessment & Plan Note (Signed)
Discussed PSA screening (risks/benefits), recommended at least 30 minutes of aerobic activity at least 5 days/week; proper sunscreen use reviewed; healthy diet and alcohol recommendations (less than or equal to 2 drinks/day) reviewed; regular seatbelt use; changing batteries in smoke detectors. Immunization recommendations discussed.  Colonoscopy recommendations reviewed. ° °

## 2020-09-29 NOTE — Progress Notes (Signed)
Health Maintenance reviewed -  Immunization History  Administered Date(s) Administered   Fluad Quad(high Dose 65+) 09/29/2020   Tdap 10/07/2018   Last colonoscopy: waiting to have scheduled Last PSA: Ordered today Dentist: last 6 months, implant and crown/bridge- two teeth pulled Ophtho: glasses, eye dr this month Exercise: active daily, nothing structured   Smoker: no  Alcohol Use: limited   Other doctors caring for patient include:  Patient Care Team: Perlie Mayo, NP as PCP - General (Family Medicine)  End of Life Discussion:  Patient has a living will and medical power of attorney  Subjective:   HPI  Andres Robinson is a 64 y.o. male who presents for annual wellness visit and follow-up on chronic medical conditions.  He has the following concerns: Overall he reports he is doing well.  He does report some occasional groin discomfort that will occasionally wake him up at night he reports maybe 3 times over the last year and pain in the prostate when he sits on the mower for too long time.  Does not feel like he needs to see urologist at this time.  We will check PSA today  He reports that he has not been eating the best he thinks his blood sugar numbers have increased but he has not been eating watermelon is made sandwiches and other good foods over the summer.  Last A1c check was in June it was 5.6%.  He is followed by Dr. Dorris Fetch for this.  Is taking his Metformin as directed.  He reports his CPAP is been giving him some trouble he is in contact with the Insurance claims handler.  To see if he might need a new one.  Otherwise he denies having any sleep issues.  He denies having any trouble chewing or swallowing.  Did have to have 2 teeth pulled earlier this year.  Denies having any changes in bowel or bladder habits as stated above possible need for urologist in near future if any changes continue to occur.  Denies having blood in urine or stool.  Denies having any memory changes  denies having any falls or injuries.  Denies having any skin issues, hearing changes or vision changes.  Denies having any chest pain, palpitations, leg swelling, headaches, dizziness, cough, fever, chills, shortness of breath, sinus issues or any other signs symptoms of possible infection.  Will be getting flu shot today.  Will give booster once able.  Review Of Systems  Review of Systems  Constitutional: Negative.   HENT: Negative.   Respiratory: Negative.   Cardiovascular: Negative.   Gastrointestinal: Negative.   Endocrine: Negative.   Genitourinary: Negative.        See hpi  Musculoskeletal: Negative.   Skin: Negative.   Neurological: Negative.   Psychiatric/Behavioral: Negative.     Objective:   PHYSICAL EXAM:  BP 130/84    Pulse 66    Resp 16    Ht 5\' 11"  (1.803 m)    Wt 218 lb (98.9 kg)    SpO2 95%    BMI 30.40 kg/m      Physical Exam Vitals and nursing note reviewed.  Constitutional:      Appearance: Normal appearance. He is well-developed and well-groomed. He is obese.  HENT:     Head: Normocephalic and atraumatic.     Right Ear: Hearing and external ear normal.     Left Ear: Hearing and external ear normal.     Nose: Nose normal.     Mouth/Throat:  Lips: Pink.     Mouth: Mucous membranes are moist.     Pharynx: Oropharynx is clear. Uvula midline.  Eyes:     General: Lids are normal.        Right eye: No discharge.        Left eye: No discharge.     Extraocular Movements: Extraocular movements intact.     Conjunctiva/sclera: Conjunctivae normal.     Pupils: Pupils are equal, round, and reactive to light.  Neck:     Thyroid: No thyroid mass, thyromegaly or thyroid tenderness.     Vascular: No carotid bruit.  Cardiovascular:     Rate and Rhythm: Normal rate and regular rhythm.     Pulses: Normal pulses.          Dorsalis pedis pulses are 2+ on the right side and 2+ on the left side.       Posterior tibial pulses are 2+ on the right side and 2+ on  the left side.     Heart sounds: Normal heart sounds.  Pulmonary:     Effort: Pulmonary effort is normal.     Breath sounds: Normal breath sounds and air entry.  Abdominal:     General: Bowel sounds are normal. There is no distension.     Palpations: Abdomen is soft.     Tenderness: There is no abdominal tenderness. There is no right CVA tenderness or left CVA tenderness.     Hernia: No hernia is present.  Musculoskeletal:        General: Normal range of motion.     Cervical back: Full passive range of motion without pain, normal range of motion and neck supple.     Right lower leg: No edema.     Left lower leg: No edema.     Comments: MAE, ROM intact   Feet:     Right foot:     Skin integrity: Skin integrity normal.     Left foot:     Skin integrity: Skin integrity normal.  Lymphadenopathy:     Cervical: No cervical adenopathy.  Skin:    General: Skin is warm and dry.     Capillary Refill: Capillary refill takes less than 2 seconds.  Neurological:     General: No focal deficit present.     Mental Status: He is alert and oriented to person, place, and time.     Cranial Nerves: Cranial nerves are intact.     Sensory: Sensation is intact.     Motor: Motor function is intact.     Coordination: Coordination is intact.     Gait: Gait is intact.     Deep Tendon Reflexes: Reflexes are normal and symmetric.  Psychiatric:        Attention and Perception: Attention and perception normal.        Mood and Affect: Mood and affect normal.        Speech: Speech normal.        Behavior: Behavior normal. Behavior is cooperative.        Thought Content: Thought content normal.        Cognition and Memory: Cognition and memory normal.        Judgment: Judgment normal.     Depression Screening  Depression screen [facility] 2/9 09/29/2020 12/23/2019 10/27/2019  Decreased Interest 0 0 0  Down, Depressed, Hopeless 0 0 0  PHQ - 2 Score 0 0 0     Falls  Fall Risk  09/29/2020 03/03/2020 12/23/2019  10/27/2019  Falls in the past year? 1 0 0 0  Number falls in past yr: 1 0 0 0  Injury with Fall? 0 0 0 0    Assessment & Plan:   1. Annual visit for general adult medical examination with abnormal findings   2. Type 2 diabetes mellitus with stage 2 chronic kidney disease, without long-term current use of insulin ([place])   3. Gastroesophageal reflux disease without esophagitis   4. Benign prostatic hyperplasia, unspecified whether lower urinary tract symptoms present   5. Mixed hyperlipidemia   6. Obesity (BMI 30-39.9)   7. Need for immunization against influenza     Tests ordered Orders Placed This Encounter  Procedures   Flu Vaccine QUAD High Dose(Fluad)   CBC   Comprehensive metabolic panel   Lipid panel   PSA    Plan: Please see assessment and plan per problem list above.   No orders of the defined types were placed in this encounter.  I have personally reviewed: The patient's medical and social history Their use of alcohol, tobacco or illicit drugs Their current medications and supplements The patient's functional ability including ADLs,fall risks, home safety risks, cognitive, and hearing and visual impairment Diet and physical activities Evidence for depression or mood disorders  The patient's weight, height, BMI, and visual acuity have been recorded in the chart.  I have made referrals, counseling, and provided education to the patient based on review of the above and I have provided the patient with a written personalized care plan for preventive services.    Note: This dictation was prepared with Dragon dictation along with smaller phrase technology. Similar sounding words can be transcribed inadequately or may not be corrected upon review. Any transcriptional errors that result from this process are unintentional.      Perlie Mayo, NP   09/29/2020

## 2020-09-29 NOTE — Assessment & Plan Note (Signed)

## 2020-09-29 NOTE — Assessment & Plan Note (Signed)
Updated labs order, heart healthy diet encouraged

## 2020-09-29 NOTE — Assessment & Plan Note (Signed)
Stable  Andres Robinson is educated about the importance of exercise daily to help with weight management. A minumum of 30 minutes daily is recommended. Additionally, importance of healthy food choices  with portion control discussed.   Wt Readings from Last 3 Encounters:  09/29/20 218 lb (98.9 kg)  06/22/20 217 lb (98.4 kg)  06/22/20 217 lb 12.8 oz (98.8 kg)

## 2020-09-29 NOTE — Assessment & Plan Note (Signed)
On flomax, overall controlled. Does have pain in groin by unsure if related.

## 2020-09-29 NOTE — Assessment & Plan Note (Signed)
Followed by Dr Dorris Fetch Overall controlled

## 2020-10-06 LAB — COMPREHENSIVE METABOLIC PANEL
ALT: 22 IU/L (ref 0–44)
AST: 19 IU/L (ref 0–40)
Albumin/Globulin Ratio: 1.6 (ref 1.2–2.2)
Albumin: 4.4 g/dL (ref 3.8–4.8)
Alkaline Phosphatase: 54 IU/L (ref 44–121)
BUN/Creatinine Ratio: 21 (ref 10–24)
BUN: 22 mg/dL (ref 8–27)
Bilirubin Total: 0.7 mg/dL (ref 0.0–1.2)
CO2: 23 mmol/L (ref 20–29)
Calcium: 8.9 mg/dL (ref 8.6–10.2)
Chloride: 103 mmol/L (ref 96–106)
Creatinine, Ser: 1.07 mg/dL (ref 0.76–1.27)
GFR calc Af Amer: 83 mL/min/{1.73_m2} (ref >59)
GFR calc non Af Amer: 72 mL/min/{1.73_m2} (ref >59)
Globulin, Total: 2.7 g/dL (ref 1.5–4.5)
Glucose: 131 mg/dL — ABNORMAL HIGH (ref 65–99)
Potassium: 4.7 mmol/L (ref 3.5–5.2)
Sodium: 139 mmol/L (ref 134–144)
Total Protein: 7.1 g/dL (ref 6.0–8.5)

## 2020-10-06 LAB — CBC
Hematocrit: 42.3 % (ref 37.5–51.0)
Hemoglobin: 14.7 g/dL (ref 13.0–17.7)
MCH: 32.5 pg (ref 26.6–33.0)
MCHC: 34.8 g/dL (ref 31.5–35.7)
MCV: 93 fL (ref 79–97)
Platelets: 185 10*3/uL (ref 150–450)
RBC: 4.53 x10E6/uL (ref 4.14–5.80)
RDW: 12.6 % (ref 11.6–15.4)
WBC: 3.1 10*3/uL — ABNORMAL LOW (ref 3.4–10.8)

## 2020-10-06 LAB — LIPID PANEL
Chol/HDL Ratio: 2.8 ratio (ref 0.0–5.0)
Cholesterol, Total: 124 mg/dL (ref 100–199)
HDL: 45 mg/dL (ref >39)
LDL Chol Calc (NIH): 59 mg/dL (ref 0–99)
Triglycerides: 110 mg/dL (ref 0–149)
VLDL Cholesterol Cal: 20 mg/dL (ref 5–40)

## 2020-10-06 LAB — PSA: Prostate Specific Ag, Serum: 0.7 ng/mL (ref 0.0–4.0)

## 2020-10-15 LAB — SPECIMEN STATUS REPORT

## 2020-10-15 LAB — HGB A1C W/O EAG: Hgb A1c MFr Bld: 5.8 % — ABNORMAL HIGH (ref 4.8–5.6)

## 2020-10-19 LAB — HM DIABETES EYE EXAM

## 2020-10-20 ENCOUNTER — Other Ambulatory Visit: Payer: Self-pay | Admitting: "Endocrinology

## 2020-12-08 ENCOUNTER — Other Ambulatory Visit: Payer: Self-pay

## 2020-12-08 DIAGNOSIS — N182 Chronic kidney disease, stage 2 (mild): Secondary | ICD-10-CM

## 2020-12-13 ENCOUNTER — Other Ambulatory Visit: Payer: Self-pay

## 2020-12-13 ENCOUNTER — Encounter: Payer: Medicare HMO | Attending: Family Medicine | Admitting: Nutrition

## 2020-12-13 ENCOUNTER — Encounter: Payer: Self-pay | Admitting: Nutrition

## 2020-12-13 ENCOUNTER — Other Ambulatory Visit: Payer: Self-pay | Admitting: "Endocrinology

## 2020-12-13 VITALS — Ht 71.0 in | Wt 226.0 lb

## 2020-12-13 DIAGNOSIS — E782 Mixed hyperlipidemia: Secondary | ICD-10-CM | POA: Diagnosis present

## 2020-12-13 DIAGNOSIS — E1122 Type 2 diabetes mellitus with diabetic chronic kidney disease: Secondary | ICD-10-CM | POA: Insufficient documentation

## 2020-12-13 DIAGNOSIS — N182 Chronic kidney disease, stage 2 (mild): Secondary | ICD-10-CM | POA: Diagnosis present

## 2020-12-13 DIAGNOSIS — E669 Obesity, unspecified: Secondary | ICD-10-CM | POA: Diagnosis present

## 2020-12-13 NOTE — Progress Notes (Signed)
°  Medical Nutrition Therapy:  Appt start time: 1430  end time:  1500   Assessment:  Primary concerns today: Diabetes Type 2.  FBS: 130-140's.  Tends to not eat a big lunch and then eats more at night and snacks after supper sometimes. Doesn't check BS at night. Admits he has been snacking some with the holidays and not exercising as much. Willing to get back on track. Sees Dr. Dorris Fetch next week. Gained about 8 lbs in the last 2 months. Still taking 500 mg of Metformin BID.  Lab Results  Component Value Date   HGBA1C 5.8 (H) 10/05/2020   CMP Latest Ref Rng & Units 10/05/2020 06/16/2020 03/10/2020  Glucose 65 - 99 mg/dL 131(H) 123(H) 121(H)  BUN 8 - 27 mg/dL 22 27(H) 23  Creatinine 0.76 - 1.27 mg/dL 1.07 1.08 1.15  Sodium 134 - 144 mmol/L 139 139 141  Potassium 3.5 - 5.2 mmol/L 4.7 4.2 4.9  Chloride 96 - 106 mmol/L 103 105 107  CO2 20 - 29 mmol/L 23 27 28   Calcium 8.6 - 10.2 mg/dL 8.9 9.1 9.8  Total Protein 6.0 - 8.5 g/dL 7.1 6.7 7.6  Total Bilirubin 0.0 - 1.2 mg/dL 0.7 0.6 0.7  Alkaline Phos 44 - 121 IU/L 54 - -  AST 0 - 40 IU/L 19 17 22   ALT 0 - 44 IU/L 22 19 22      Preferred Learning Style:    No preference indicated    Learning Readiness:    Ready  Change in progress   MEDICATIONS:    DIETARY INTAKE:   B) [place]  toast and banana or bacon and eggs, coffee, L) Steamed vegetables, chicken wings and water D)Vegetable soup and salad, water  Usual physical activity: walks some.   Estimated energy needs: 1800  calories 200 g carbohydrates 135  g protein 50 g fat  Progress Towards Goal(s):  In progress.   Nutritional Diagnosis:  NB-1.1 Food and nutrition-related knowledge deficit As related to Diabetes.  As evidenced by A1C 7.2%.    Intervention: Nutrition and Diabetes education provided on My Plate, CHO counting, meal planning, portion sizes, timing of meals, avoiding snacks between meals unless having a low blood sugar, target ranges for A1C and blood sugars,  signs/symptoms and treatment of hyper/hypoglycemia, monitoring blood sugars, taking medications as prescribed, benefits of exercising 30 minutes per day and prevention of complications of DM. Follow up 3 months Goals   Watch portions Increase water intake to 100 oz. Increase walking Cut out after after 7 pm. Lose 2-3 lbs per month.  Teaching Method Utilized: Visual Auditory Hands on  Handouts given during visit include:  The Plate Method   Diabetes Instructions.   Barriers to learning/adherence to lifestyle change: none  Demonstrated degree of understanding via:  Teach Back   Monitoring/Evaluation:  Dietary intake, exercise, , and body weight in 3 month(s)

## 2020-12-13 NOTE — Patient Instructions (Addendum)
Goals   Watch portions Increase water intake to 100 oz. Increase walking Cut out after after 7 pm. Lose 2-3 lbs per month.

## 2020-12-14 LAB — COMPREHENSIVE METABOLIC PANEL
ALT: 21 IU/L (ref 0–44)
AST: 21 IU/L (ref 0–40)
Albumin/Globulin Ratio: 1.9 (ref 1.2–2.2)
Albumin: 4.7 g/dL (ref 3.8–4.8)
Alkaline Phosphatase: 58 IU/L (ref 44–121)
BUN/Creatinine Ratio: 16 (ref 10–24)
BUN: 18 mg/dL (ref 8–27)
Bilirubin Total: 0.5 mg/dL (ref 0.0–1.2)
CO2: 23 mmol/L (ref 20–29)
Calcium: 9.5 mg/dL (ref 8.6–10.2)
Chloride: 102 mmol/L (ref 96–106)
Creatinine, Ser: 1.11 mg/dL (ref 0.76–1.27)
GFR calc Af Amer: 80 mL/min/{1.73_m2} (ref >59)
GFR calc non Af Amer: 69 mL/min/{1.73_m2} (ref >59)
Globulin, Total: 2.5 g/dL (ref 1.5–4.5)
Glucose: 93 mg/dL (ref 65–99)
Potassium: 4.7 mmol/L (ref 3.5–5.2)
Sodium: 141 mmol/L (ref 134–144)
Total Protein: 7.2 g/dL (ref 6.0–8.5)

## 2020-12-14 LAB — SPECIMEN STATUS REPORT

## 2020-12-23 ENCOUNTER — Ambulatory Visit: Payer: Medicare HMO | Admitting: "Endocrinology

## 2020-12-23 ENCOUNTER — Other Ambulatory Visit: Payer: Self-pay

## 2020-12-23 ENCOUNTER — Ambulatory Visit: Payer: Medicare HMO | Admitting: Nutrition

## 2020-12-23 ENCOUNTER — Encounter: Payer: Self-pay | Admitting: "Endocrinology

## 2020-12-23 VITALS — BP 135/87 | HR 66 | Ht 71.0 in | Wt 226.6 lb

## 2020-12-23 DIAGNOSIS — N182 Chronic kidney disease, stage 2 (mild): Secondary | ICD-10-CM | POA: Diagnosis not present

## 2020-12-23 DIAGNOSIS — E1122 Type 2 diabetes mellitus with diabetic chronic kidney disease: Secondary | ICD-10-CM

## 2020-12-23 DIAGNOSIS — E782 Mixed hyperlipidemia: Secondary | ICD-10-CM

## 2020-12-23 MED ORDER — METFORMIN HCL 500 MG PO TABS
500.0000 mg | ORAL_TABLET | Freq: Every day | ORAL | 3 refills | Status: DC
Start: 2020-12-23 — End: 2021-10-28

## 2020-12-23 NOTE — Patient Instructions (Signed)
                                     Advice for Weight Management  -For most of us the best way to lose weight is by diet management. Generally speaking, diet management means consuming less calories intentionally which over time brings about progressive weight loss.  This can be achieved more effectively by restricting carbohydrate consumption to the minimum possible.  So, it is critically important to know your numbers: how much calorie you are consuming and how much calorie you need. More importantly, our carbohydrates sources should be unprocessed or minimally processed complex starch food items.   Sometimes, it is important to balance nutrition by increasing protein intake (animal or plant source), fruits, and vegetables.  -Sticking to a routine mealtime to eat 3 meals a day and avoiding unnecessary snacks is shown to have a big role in weight control. Under normal circumstances, the only time we lose real weight is when we are hungry, so allow hunger to take place- hunger means no food between meal times, only water.  It is not advisable to starve.   -It is better to avoid simple carbohydrates including: Cakes, Sweet Desserts, Ice Cream, Soda (diet and regular), Sweet Tea, Candies, Chips, Cookies, Store Bought Juices, Alcohol in Excess of  1-2 drinks a day, Lemonade,  Artificial Sweeteners, Doughnuts, Coffee Creamers, "Sugar-free" Products, etc, etc.  This is not a complete list.....    -Consulting with certified diabetes educators is proven to provide you with the most accurate and current information on diet.  Also, you may be  interested in discussing diet options/exchanges , we can schedule a visit with Andres Robinson, RDN, CDE for individualized nutrition education.  -Exercise: If you are able: 30 -60 minutes a day ,4 days a week, or 150 minutes a week.  The longer the better.  Combine stretch, strength, and aerobic activities.  If you were told in the  past that you have high risk for cardiovascular diseases, you may seek evaluation by your heart doctor prior to initiating moderate to intense exercise programs.                                  Additional Care Considerations for Diabetes   -Diabetes  is a chronic disease.  The most important care consideration is regular follow-up with your diabetes care provider with the goal being avoiding or delaying its complications and to take advantage of advances in medications and technology.    -Type 2 diabetes is known to coexist with other important comorbidities such as high blood pressure and high cholesterol.  It is critical to control not only the diabetes but also the high blood pressure and high cholesterol to minimize and delay the risk of complications including coronary artery disease, stroke, amputations, blindness, etc.    - Studies showed that people with diabetes will benefit from a class of medications known as ACE inhibitors and statins.  Unless there are specific reasons not to be on these medications, the standard of care is to consider getting one from these groups of medications at an optimal doses.  These medications are generally considered safe and proven to help protect the heart and the kidneys.    - People with diabetes are encouraged to initiate and maintain regular follow-up with eye doctors, foot   doctors, dentists , and if necessary heart and kidney doctors.     - It is highly recommended that people with diabetes quit smoking or stay away from smoking, and get yearly  flu vaccine and pneumonia vaccine at least every 5 years.  One other important lifestyle recommendation is to ensure adequate sleep - at least 6-7 hours of uninterrupted sleep at night.  -Exercise: If you are able: 30 -60 minutes a day, 4 days a week, or 150 minutes a week.  The longer the better.  Combine stretch, strength, and aerobic activities.  If you were told in the past that you have high risk for  cardiovascular diseases, you may seek evaluation by your heart doctor prior to initiating moderate to intense exercise programs.          

## 2020-12-23 NOTE — Progress Notes (Signed)
12/23/2020, 1:52 PM  Endocrinology follow-up note   Subjective:    Patient ID: Andres Robinson, male    DOB: 10/17/1956.  Andres Robinson is being seen in  Follow up after he was seen in consultation for management of currently uncontrolled symptomatic diabetes requested by  Perlie Mayo, NP.   Past Medical History:  Diagnosis Date  . Acute bacterial sinusitis 03/26/2020  . DDD (degenerative disc disease), cervical   . DDD (degenerative disc disease), lumbar   . High cholesterol   . Normal cardiac stress test 2005  . RMSF [facility] spotted fever) 2013  . Type 2 diabetes mellitus with stage 2 chronic kidney disease, without long-term current use of insulin ([facility]) 09/16/2019    Past Surgical History:  Procedure Laterality Date  . CATARACT EXTRACTION Bilateral   . COLONOSCOPY    . ESOPHAGOGASTRODUODENOSCOPY N/A 01/14/2014   Procedure: ESOPHAGOGASTRODUODENOSCOPY (EGD);  Surgeon: Rogene Houston, MD;  Location: [facility];  Service: Endoscopy;  Laterality: N/A;  320    Social History   Socioeconomic History  . Marital status: Married    Spouse name: Carolynn Serve   . Number of children: 0  . Years of education: Not on file  . Highest education level: Some college, no degree  Occupational History  . Not on file  Tobacco Use  . Smoking status: Former Research scientist (life sciences)  . Smokeless tobacco: Never Used  . Tobacco comment: quit in 1982  Substance and Sexual Activity  . Alcohol use: Yes    Comment: 2-3 glasses wine a week  . Drug use: No  . Sexual activity: Yes    Partners: Female  Other Topics Concern  . Not on file  Social History Narrative   Lives with Tena married since 1981      Pets: 1 boston terrier -deaf and one eye: Ladell Heads      Enjoys: hunt and being outside      Diet: eat all food groups   Caffeine: 4 cups of coffee   Water: 4 glasses a day      Wears seat belt    Smoke detectors    Does not use phone while driving   Social  Determinants of Radio broadcast assistant Strain: Low Risk   . Difficulty of Paying Living Expenses: Not hard at all  Food Insecurity: No Food Insecurity  . Worried About Charity fundraiser in the Last Year: Never true  . Ran Out of Food in the Last Year: Never true  Transportation Needs: No Transportation Needs  . Lack of Transportation (Medical): No  . Lack of Transportation (Non-Medical): No  Physical Activity: Sufficiently Active  . Days of Exercise per Week: 5 days  . Minutes of Exercise per Session: 30 min  Stress: No Stress Concern Present  . Feeling of Stress : Only a little  Social Connections: Moderately Integrated  . Frequency of Communication with Friends and Family: More than three times a week  . Frequency of Social Gatherings with Friends and Family: More than three times a week  . Attends Religious Services: 1 to 4 times per year  . Active Member of Clubs or Organizations: No  . Attends Archivist Meetings: Never  . Marital Status: Married    Family History  Problem Relation Age of Onset  . Heart disease Father   . Diabetes Father   . Hyperlipidemia Father   . Hypertension Father   .  AAA (abdominal aortic aneurysm) Father   . Peripheral vascular disease Father        amputation  . Hypertension Brother   . Heart attack Brother     Outpatient Encounter Medications as of 12/23/2020  Medication Sig  . melatonin 5 MG TABS Take 5 mg by mouth at bedtime.  . metFORMIN (GLUCOPHAGE) 500 MG tablet Take 1 tablet (500 mg total) by mouth daily with breakfast.  . ONETOUCH ULTRA test strip CHECK BLOOD SUGAR ONCE ANDAY  . simvastatin (ZOCOR) 20 MG tablet Take 1 tablet (20 mg total) by mouth daily at 6 PM.  . tamsulosin (FLOMAX) 0.4 MG CAPS capsule Take 1 capsule (0.4 mg total) by mouth 2 (two) times daily.  . [DISCONTINUED] metFORMIN (GLUCOPHAGE) 500 MG tablet Take 1 tablet (500 mg total) by mouth 2 (two) times daily with a meal.   No facility-administered  encounter medications on file as of 12/23/2020.    ALLERGIES: Allergies  Allergen Reactions  . Penicillins Other (See Comments)    Childhood Reaction     VACCINATION STATUS: Immunization History  Administered Date(s) Administered  . Fluad Quad(high Dose 65+) 09/29/2020  . Tdap 10/07/2018    Diabetes He presents for his follow-up diabetic visit. He has type 2 diabetes mellitus. Onset time: He was diagnosed at approximate age of 44 years. His disease course has been stable. There are no hypoglycemic associated symptoms. Pertinent negatives for hypoglycemia include no confusion, headaches, pallor or seizures. There are no diabetic associated symptoms. Pertinent negatives for diabetes include no chest pain, no fatigue, no polydipsia, no polyphagia, no polyuria and no weakness. There are no hypoglycemic complications. Symptoms are stable. There are no diabetic complications. Risk factors for coronary artery disease include diabetes mellitus, dyslipidemia, male sex, obesity, tobacco exposure, sedentary lifestyle and family history. Current diabetic treatment includes oral agent (monotherapy). His weight is fluctuating minimally. He is following a diabetic diet. He has not had a previous visit with a dietitian. He rarely participates in exercise. His home blood glucose trend is fluctuating minimally. His breakfast blood glucose range is generally 110-130 mg/dl. His bedtime blood glucose range is generally 130-140 mg/dl. His overall blood glucose range is 130-140 mg/dl. (He presents with controlled glycemic profile and previsit labs showing A1c of 5.8%, overall improving from 7.2%.      ) An ACE inhibitor/angiotensin II receptor blocker is not being taken. Eye exam is current.  Hyperlipidemia This is a chronic problem. The current episode started more than 1 year ago. The problem is uncontrolled. Exacerbating diseases include diabetes and obesity. Pertinent negatives include no chest pain, myalgias or  shortness of breath. Current antihyperlipidemic treatment includes statins. Risk factors for coronary artery disease include dyslipidemia, diabetes mellitus, obesity, male sex and a sedentary lifestyle.     Review of Systems  Constitutional: Negative for chills, fatigue, fever and unexpected weight change.  HENT: Negative for dental problem, mouth sores and trouble swallowing.   Eyes: Negative for visual disturbance.  Respiratory: Negative for cough, choking, chest tightness, shortness of breath and wheezing.   Cardiovascular: Negative for chest pain, palpitations and leg swelling.  Gastrointestinal: Negative for abdominal distention, abdominal pain, constipation, diarrhea, nausea and vomiting.  Endocrine: Negative for polydipsia, polyphagia and polyuria.  Genitourinary: Negative for dysuria, flank pain, hematuria and urgency.  Musculoskeletal: Negative for back pain, gait problem, myalgias and neck pain.  Skin: Negative for pallor, rash and wound.  Neurological: Negative for seizures, syncope, weakness, numbness and headaches.  Psychiatric/Behavioral: Negative for confusion  and dysphoric mood.    Objective:    BP 135/87   Pulse 66   Ht 5\' 11"  (1.803 m)   Wt 226 lb 9.6 oz (102.8 kg)   BMI 31.60 kg/m   Wt Readings from Last 3 Encounters:  12/23/20 226 lb 9.6 oz (102.8 kg)  12/13/20 226 lb (102.5 kg)  09/29/20 218 lb (98.9 kg)     Physical Exam Constitutional:      General: He is not in acute distress.    Appearance: He is well-developed.  HENT:     Head: Normocephalic and atraumatic.  Neck:     Thyroid: No thyromegaly.     Trachea: No tracheal deviation.  Cardiovascular:     Rate and Rhythm: Normal rate.     Pulses:          Dorsalis pedis pulses are 1+ on the right side and 1+ on the left side.       Posterior tibial pulses are 1+ on the right side and 1+ on the left side.     Heart sounds: S1 normal and S2 normal. No murmur heard. No gallop.   Pulmonary:     Effort:  Pulmonary effort is normal. No respiratory distress.     Breath sounds: No wheezing.  Abdominal:     General: There is no distension.     Tenderness: There is no abdominal tenderness. There is no guarding.  Musculoskeletal:     Right shoulder: No swelling or deformity.     Cervical back: Normal range of motion and neck supple.  Skin:    General: Skin is warm and dry.     Findings: No rash.     Nails: There is no clubbing.  Neurological:     Mental Status: He is alert and oriented to person, place, and time.     Cranial Nerves: No cranial nerve deficit.     Sensory: No sensory deficit.     Gait: Gait normal.     Deep Tendon Reflexes: Reflexes are normal and symmetric.  Psychiatric:        Speech: Speech normal.        Behavior: Behavior normal. Behavior is cooperative.        Thought Content: Thought content normal.        Judgment: Judgment normal.      CMP     Component Value Date/Time   NA 141 12/13/2020 1457   K 4.7 12/13/2020 1457   CL 102 12/13/2020 1457   CO2 23 12/13/2020 1457   GLUCOSE 93 12/13/2020 1457   GLUCOSE 123 (H) 06/16/2020 0731   BUN 18 12/13/2020 1457   CREATININE 1.11 12/13/2020 1457   CREATININE 1.08 06/16/2020 0731   CALCIUM 9.5 12/13/2020 1457   PROT 7.2 12/13/2020 1457   ALBUMIN 4.7 12/13/2020 1457   AST 21 12/13/2020 1457   ALT 21 12/13/2020 1457   ALKPHOS 58 12/13/2020 1457   BILITOT 0.5 12/13/2020 1457   GFRNONAA 69 12/13/2020 1457   GFRNONAA 66 03/10/2020 0943   GFRAA 80 12/13/2020 1457   GFRAA 76 03/10/2020 0943     Diabetic Labs (most recent): Lab Results  Component Value Date   HGBA1C 5.8 (H) 10/05/2020   HGBA1C 5.6 06/22/2020   HGBA1C 5.4 12/23/2019     Lipid Panel ( most recent) Lipid Panel     Component Value Date/Time   CHOL 124 10/05/2020 0815   TRIG 110 10/05/2020 0815   HDL 45 10/05/2020 0815  CHOLHDL 2.8 10/05/2020 0815   CHOLHDL 2.8 03/10/2020 0943   LDLCALC 59 10/05/2020 0815   LDLCALC 55 03/10/2020 0943       Assessment & Plan:   1. Type 2 diabetes mellitus with stage 2 chronic kidney disease, without long-term current use of insulin ([facility])  - Donna Silverman Corales has currently controlled asymptomatic type 2 DM since  64 years of age.  He presents with controlled glycemic profile and previsit labs showing A1c of 5.8%, overall improving from 7.2%.   Recent labs reviewed. - I had a long discussion with him about the progressive nature of diabetes and the pathology behind its complications. -He does has mild microalbuminuria, no gross complications , however, he remains at a high risk for more acute and chronic complications which include CAD, CVA, CKD, retinopathy, and neuropathy. These are all discussed in detail with him.  - I have counseled him on diet  and weight management  by adopting a carbohydrate restricted/protein rich diet. Patient is encouraged to switch to  unprocessed or minimally processed     complex starch and increased protein intake (animal or plant source), fruits, and vegetables. -  he is advised to stick to a routine mealtimes to eat 3 meals  a day and avoid unnecessary snacks ( to snack only to correct hypoglycemia).   - he acknowledges that there is a room for improvement in his food and drink choices. - Suggestion is made for him to avoid simple carbohydrates  from his diet including Cakes, Sweet Desserts, Ice Cream, Soda (diet and regular), Sweet Tea, Candies, Chips, Cookies, Store Bought Juices, Alcohol in Excess of  1-2 drinks a day, Artificial Sweeteners,  Coffee Creamer, and "Sugar-free" Products, Lemonade. This will help patient to have more stable blood glucose profile and potentially avoid unintended weight gain.  - he has been  scheduled with Norm Salt, RDN, CDE for diabetes education.  - I have approached him with the following individualized plan to manage  his diabetes and patient agrees:   -He is advised to lower Metformin to 500 mg p.o. daily after  breakfast.  He will take a break from monitoring blood glucose.   -   He has lost approximately 30 pounds overall.   Most of this is intentional by changing his diet.  -He has several options if he loses control of diabetes during his next visit, including glipizide therapy, insulin, incretin therapy as appropriate.  - Specific targets for  A1c;  LDL, HDL, Triglycerides,  were discussed with the patient.  2) Blood Pressure /Hypertension: His blood pressure is controlled to target.  He is not on any antihypertensive medications.  3) Lipids/Hyperlipidemia:   Review of his recent lipid panel showed controlled  LDL 55, improving from 80.  He is advised to continue simvastatin 20 mg p.o. daily at bedtime.   Side effects and precautions discussed with him.  4)  Weight/Diet:  Body mass index is 31.6 kg/m.  -   clearly complicating his diabetes care.   he is  a candidate for weight loss. I discussed with him the fact that loss of 5 - 10% of his  current body weight will have the most impact on his diabetes management.  Exercise, and detailed carbohydrates information provided  -  detailed on discharge instructions.  5) Chronic Care/Health Maintenance:  -he  is on Statin medications and  is encouraged to initiate and continue to follow up with Ophthalmology, Dentist,  Podiatrist at least yearly or according  to recommendations, and advised to   stay away from smoking. I have recommended yearly flu vaccine and pneumonia vaccine at least every 5 years; moderate intensity exercise for up to 150 minutes weekly; and  sleep for at least 7 hours a day.   POCT ABI Results 12/23/20  He is ABIs normal today. Right ABI: 1.22      left ABI: 1.19  Right leg systolic / diastolic: [id number] over 95 mmHg Left leg systolic / diastolic: [id number] mmHg  Arm systolic / diastolic: 0000000 mmHG This study will be repeated in December 2026, or sooner if needed.   - he is  advised to maintain close follow up with Perlie Mayo, NP for primary care needs, as well as his other providers for optimal and coordinated care.  - Time spent on this patient care encounter:  35 min, of which > 50% was spent in  counseling and the rest reviewing his blood glucose logs , discussing his hypoglycemia and hyperglycemia episodes, reviewing his current and  previous labs / studies  ( including abstraction from other facilities) and medications  doses and developing a  long term treatment plan and documenting his care.   Please refer to Patient Instructions for Blood Glucose Monitoring and Insulin/Medications Dosing Guide"  in media tab for additional information. Please  also refer to " Patient Self Inventory" in the Media  tab for reviewed elements of pertinent patient history.  [name] participated in the discussions, expressed understanding, and voiced agreement with the above plans.  All questions were answered to his satisfaction. he is encouraged to contact clinic should he have any questions or concerns prior to his return visit.    Follow up plan: - Return in about 1 year (around 12/23/2021) for F/U with Pre-visit Labs, A1c -NV, Urine MA - NV.  Glade Lloyd, MD [facility] [address] [place], [place] [address] Phone: [phone]  Fax: [phone]    12/23/2020, 1:52 PM  This note was partially dictated with voice recognition software. Similar sounding words can be transcribed inadequately or may not  be corrected upon review.

## 2021-03-30 ENCOUNTER — Ambulatory Visit: Payer: Medicare HMO | Admitting: Nurse Practitioner

## 2021-04-15 ENCOUNTER — Other Ambulatory Visit: Payer: Self-pay | Admitting: Family Medicine

## 2021-04-15 DIAGNOSIS — N401 Enlarged prostate with lower urinary tract symptoms: Secondary | ICD-10-CM

## 2021-04-22 ENCOUNTER — Telehealth: Payer: Self-pay | Admitting: Family Medicine

## 2021-04-22 NOTE — Telephone Encounter (Signed)
Left message for patient to call back and schedule Medicare Annual Wellness Visit (AWV) either virtually or in office.   AWV-I PER PALMETTO 01/26/20  please schedule at anytime with [facility]  health coach  This should be a 40 minute visit.

## 2021-05-02 ENCOUNTER — Ambulatory Visit (INDEPENDENT_AMBULATORY_CARE_PROVIDER_SITE_OTHER): Payer: Medicare HMO

## 2021-05-02 ENCOUNTER — Other Ambulatory Visit: Payer: Self-pay

## 2021-05-02 DIAGNOSIS — Z1211 Encounter for screening for malignant neoplasm of colon: Secondary | ICD-10-CM

## 2021-05-02 DIAGNOSIS — Z0000 Encounter for general adult medical examination without abnormal findings: Secondary | ICD-10-CM

## 2021-05-02 NOTE — Progress Notes (Signed)
Subjective:   Andres Robinson is a 65 y.o. male who presents for an Initial Medicare Annual Wellness Visit.  I connected with Andres Robinson today by telephone and verified that I am speaking with the correct person using two identifiers. Location patient: home Location provider: work Persons participating in the virtual visit: patient, provider.   I discussed the limitations, risks, security and privacy concerns of performing an evaluation and management service by telephone and the availability of in person appointments. I also discussed with the patient that there may be a patient responsible charge related to this service. The patient expressed understanding and verbally consented to this telephonic visit.    Interactive audio and video telecommunications were attempted between this provider and patient, however failed, due to patient having technical difficulties OR patient did not have access to video capability.  We continued and completed visit with audio only.      Review of Systems    N/A  Cardiac Risk Factors include: advanced age (>51men, >51 women);male gender;diabetes mellitus;dyslipidemia;obesity (BMI >30kg/m2)     Objective:    Today's Vitals   There is no height or weight on file to calculate BMI.  Advanced Directives 05/02/2021 01/14/2014  Does Patient Have a Medical Advance Directive? Yes Patient does not have advance directive;Patient would not like information  Type of Scientist, forensic Power of [place];Living will -  Does patient want to make changes to medical advance directive? No - Patient declined -  Copy of [place] in Chart? No - copy requested -    Current Medications (verified) Outpatient Encounter Medications as of 05/02/2021  Medication Sig  . melatonin 5 MG TABS Take 5 mg by mouth at bedtime.  . metFORMIN (GLUCOPHAGE) 500 MG tablet Take 1 tablet (500 mg total) by mouth daily with breakfast.  . ONETOUCH ULTRA test  strip CHECK BLOOD SUGAR ONCE ANDAY  . simvastatin (ZOCOR) 20 MG tablet Take 1 tablet (20 mg total) by mouth daily at 6 PM.  . tamsulosin (FLOMAX) 0.4 MG CAPS capsule TAKE 1 CAPSULE BY MOUTH 2 TIMES DAILY.  Marland Kitchen glucosamine-chondroitin 500-400 MG tablet Take 1 tablet by mouth 3 (three) times daily.   No facility-administered encounter medications on file as of 05/02/2021.    Allergies (verified) Penicillins   History: Past Medical History:  Diagnosis Date  . Acute bacterial sinusitis 03/26/2020  . DDD (degenerative disc disease), cervical   . DDD (degenerative disc disease), lumbar   . High cholesterol   . Normal cardiac stress test 2005  . RMSF [facility] spotted fever) 2013  . Type 2 diabetes mellitus with stage 2 chronic kidney disease, without long-term current use of insulin ([place]) 09/16/2019   Past Surgical History:  Procedure Laterality Date  . CATARACT EXTRACTION Bilateral   . COLONOSCOPY    . ESOPHAGOGASTRODUODENOSCOPY N/A 01/14/2014   Procedure: ESOPHAGOGASTRODUODENOSCOPY (EGD);  Surgeon: Rogene Houston, MD;  Location: [facility];  Service: Endoscopy;  Laterality: N/A;  320   Family History  Problem Relation Age of Onset  . Heart disease Father   . Diabetes Father   . Hyperlipidemia Father   . Hypertension Father   . AAA (abdominal aortic aneurysm) Father   . Peripheral vascular disease Father        amputation  . Hypertension Brother   . Heart attack Brother    Social History   Socioeconomic History  . Marital status: Married    Spouse name: Andres Robinson   . Number  of children: 0  . Years of education: Not on file  . Highest education level: Some college, no degree  Occupational History  . Not on file  Tobacco Use  . Smoking status: Former Research scientist (life sciences)  . Smokeless tobacco: Never Used  . Tobacco comment: quit in 1982  Substance and Sexual Activity  . Alcohol use: Yes    Comment: 2-3 glasses wine a week  . Drug use: No  . Sexual activity: Yes    Partners: Female   Other Topics Concern  . Not on file  Social History Narrative   Lives with Andres Robinson married since 1981      Pets: 1 boston terrier -deaf and one eye: Andres Robinson      Enjoys: hunt and being outside      Diet: eat all food groups   Caffeine: 4 cups of coffee   Water: 4 glasses a day      Wears seat belt    Smoke detectors    Does not use phone while driving   Social Determinants of Radio broadcast assistant Strain: Low Risk   . Difficulty of Paying Living Expenses: Not hard at all  Food Insecurity: No Food Insecurity  . Worried About Charity fundraiser in the Last Year: Never true  . Ran Out of Food in the Last Year: Never true  Transportation Needs: No Transportation Needs  . Lack of Transportation (Medical): No  . Lack of Transportation (Non-Medical): No  Physical Activity: Sufficiently Active  . Days of Exercise per Week: 7 days  . Minutes of Exercise per Session: 30 min  Stress: No Stress Concern Present  . Feeling of Stress : Not at all  Social Connections: Moderately Integrated  . Frequency of Communication with Friends and Family: Twice a week  . Frequency of Social Gatherings with Friends and Family: Three times a week  . Attends Religious Services: 1 to 4 times per year  . Active Member of Clubs or Organizations: No  . Attends Archivist Meetings: Never  . Marital Status: Married    Tobacco Counseling Counseling given: Not Answered Comment: quit in 1982   Clinical Intake:  Pre-visit preparation completed: Yes  Pain : No/denies pain     Nutritional Risks: None Diabetes: Yes CBG done?: No Did pt. bring in CBG monitor from home?: No  How often do you need to have someone help you when you read instructions, pamphlets, or other written materials from your doctor or pharmacy?: 1 - Never What is the last grade level you completed in school?: Some College  Diabetic?Yes Nutrition Risk Assessment:  Has the patient had any N/V/D within the last  2 months?  No  Does the patient have any non-healing wounds?  No  Has the patient had any unintentional weight loss or weight gain?  No   Diabetes:  Is the patient diabetic?  Yes  If diabetic, was a CBG obtained today?  No  Did the patient bring in their glucometer from home?  No  How often do you monitor your CBG's? Patient has not checked glucose in the past week.   Financial Strains and Diabetes Management:  Are you having any financial strains with the device, your supplies or your medication? No .  Does the patient want to be seen by Chronic Care Management for management of their diabetes?  No  Would the patient like to be referred to a Nutritionist or for Diabetic Management?  No   Diabetic  Exams:  Diabetic Eye Exam: Completed 10/19/2020 Diabetic Foot Exam: Completed 09/29/2020   Interpreter Needed?: No  Information entered by :: [place] of Daily Living In your present state of health, do you have any difficulty performing the following activities: 05/02/2021  Hearing? N  Vision? N  Difficulty concentrating or making decisions? N  Walking or climbing stairs? N  Dressing or bathing? N  Doing errands, shopping? N  Preparing Food and eating ? N  Using the Toilet? N  In the past six months, have you accidently leaked urine? N  Do you have problems with loss of bowel control? N  Managing your Medications? N  Managing your Finances? N  Housekeeping or managing your Housekeeping? N  Some recent data might be hidden    Patient Care Team: Perlie Mayo, NP as PCP - General (Family Medicine)  Indicate any recent Medical Services you may have received from other than [facility] providers in the past year (date may be approximate).     Assessment:   This is a routine wellness examination for [place].  Hearing/Vision screen  Hearing Screening   125Hz  250Hz  500Hz  1000Hz  2000Hz  3000Hz  4000Hz  6000Hz  8000Hz   Right ear:           Left ear:           Vision  Screening Comments: Patient stated gets eyes examined once per year   Dietary issues and exercise activities discussed: Current Exercise Habits: Home exercise routine, Type of exercise: walking, Time (Minutes): 30, Frequency (Times/Week): 7, Weekly Exercise (Minutes/Week): 210, Intensity: Moderate, Exercise limited by: None identified  Goals Addressed            This Visit's Progress   . Patient Stated       I would like to maintain my current health      Depression Screen PHQ 2/9 Scores 05/02/2021 09/29/2020 12/23/2019 10/27/2019  PHQ - 2 Score 0 0 0 0    Fall Risk Fall Risk  05/02/2021 09/29/2020 03/03/2020 12/23/2019 10/27/2019  Falls in the past year? 0 1 0 0 0  Number falls in past yr: 0 1 0 0 0  Injury with Fall? 0 0 0 0 0  Risk for fall due to : No Fall Risks - - - -  Follow up Falls evaluation completed;Falls prevention discussed - - - -    FALL RISK PREVENTION PERTAINING TO THE HOME:  Any stairs in or around the home? No  If so, are there any without handrails? No  Home free of loose throw rugs in walkways, pet beds, electrical cords, etc? Yes  Adequate lighting in your home to reduce risk of falls? Yes   ASSISTIVE DEVICES UTILIZED TO PREVENT FALLS:  Life alert? No  Use of a cane, walker or w/c? No  Grab bars in the bathroom? Yes  Shower chair or bench in shower? Yes  Elevated toilet seat or a handicapped toilet? Yes   Cognitive Function:     Normal cognitive status assessed by direct observation by this Nurse Health Advisor. No abnormalities found.      Immunizations Immunization History  Administered Date(s) Administered  . Fluad Quad(high Dose 65+) 09/29/2020  . Moderna Sars-Covid-2 Vaccination 02/19/2020, 03/19/2020, 01/18/2021  . Tdap 10/07/2018    TDAP status: Up to date  Flu Vaccine status: Up to date  Pneumococcal vaccine status: Due, Education has been provided regarding the importance of this vaccine. Advised may receive this vaccine at local  pharmacy or Health  Dept. Aware to provide a copy of the vaccination record if obtained from local pharmacy or Health Dept. Verbalized acceptance and understanding.  Covid-19 vaccine status: Completed vaccines  Qualifies for Shingles Vaccine? Yes   Zostavax completed No   Shingrix Completed?: No.    Education has been provided regarding the importance of this vaccine. Patient has been advised to call insurance company to determine out of pocket expense if they have not yet received this vaccine. Advised may also receive vaccine at local pharmacy or Health Dept. Verbalized acceptance and understanding.  Screening Tests Health Maintenance  Topic Date Due  . URINE MICROALBUMIN  Never done  . COLONOSCOPY (Pts 45-3yrs Insurance coverage will need to be confirmed)  Never done  . PNA vac Low Risk Adult (1 of 2 - PCV13) Never done  . HEMOGLOBIN A1C  04/05/2021  . INFLUENZA VACCINE  07/25/2021  . FOOT EXAM  09/29/2021  . OPHTHALMOLOGY EXAM  10/19/2021  . TETANUS/TDAP  10/07/2028  . COVID-19 Vaccine  Completed  . Hepatitis C Screening  Completed  . HPV VACCINES  Aged Out    Health Maintenance  Health Maintenance Due  Topic Date Due  . URINE MICROALBUMIN  Never done  . COLONOSCOPY (Pts 45-94yrs Insurance coverage will need to be confirmed)  Never done  . PNA vac Low Risk Adult (1 of 2 - PCV13) Never done  . HEMOGLOBIN A1C  04/05/2021    Colorectal cancer screening: Referral to GI placed 05/02/2021. Pt aware the office will call re: appt.  Lung Cancer Screening: (Low Dose CT Chest recommended if Age 67-80 years, 30 pack-year currently smoking OR have quit w/in 15years.) does not qualify.   Lung Cancer Screening Referral: N/A   Additional Screening:  Hepatitis C Screening: does qualify; Completed 03/03/2020  Vision Screening: Recommended annual ophthalmology exams for early detection of glaucoma and other disorders of the eye. Is the patient up to date with their annual eye exam?  Yes   Who is the provider or what is the name of the office in which the patient attends annual eye exams? Dr.Turner If pt is not established with a provider, would they like to be referred to a provider to establish care? No .   Dental Screening: Recommended annual dental exams for proper oral hygiene  Community Resource Referral / Chronic Care Management: CRR required this visit?  No   CCM required this visit?  No      Plan:     I have personally reviewed and noted the following in the patient's chart:   . Medical and social history . Use of alcohol, tobacco or illicit drugs  . Current medications and supplements including opioid prescriptions. Patient is not currently taking opioid prescriptions. . Functional ability and status . Nutritional status . Physical activity . Advanced directives . List of other physicians . Hospitalizations, surgeries, and ER visits in previous 12 months . Vitals . Screenings to include cognitive, depression, and falls . Referrals and appointments  In addition, I have reviewed and discussed with patient certain preventive protocols, quality metrics, and best practice recommendations. A written personalized care plan for preventive services as well as general preventive health recommendations were provided to patient.     Ofilia Neas, LPN   [id number]   Nurse Notes: None

## 2021-05-02 NOTE — Patient Instructions (Signed)
Andres Robinson , Thank you for taking time to come for your Medicare Wellness Visit. I appreciate your ongoing commitment to your health goals. Please review the following plan we discussed and let me know if I can assist you in the future.   Screening recommendations/referrals: Colonoscopy: Currently due, orders placed this visit Recommended yearly ophthalmology/optometry visit for glaucoma screening and checkup Recommended yearly dental visit for hygiene and checkup  Vaccinations: Influenza vaccine: Up to date, next due fall 2022 Pneumococcal vaccine: Currently due, you may receive at your next in person office visit Tdap vaccine: Up to date, next due 10/07/2028 Shingles vaccine: Currently due for Shingrix, if you would like to receive we recommend that you do so at your local pharmacy.     Advanced directives: Please bring copies of your advanced medical directives so that we may scan them into your chart.   Conditions/risks identified: None  Next appointment: 05/08/2022 @ 3:40 PM with [place] 65 Years and Older, Male Preventive care refers to lifestyle choices and visits with your health care provider that can promote health and wellness. What does preventive care include?  A yearly physical exam. This is also called an annual well check.  Dental exams once or twice a year.  Routine eye exams. Ask your health care provider how often you should have your eyes checked.  Personal lifestyle choices, including:  Daily care of your teeth and gums.  Regular physical activity.  Eating a healthy diet.  Avoiding tobacco and drug use.  Limiting alcohol use.  Practicing safe sex.  Taking low doses of aspirin every day.  Taking vitamin and mineral supplements as recommended by your health care provider. What happens during an annual well check? The services and screenings done by your health care provider during your annual well check will depend on  your age, overall health, lifestyle risk factors, and family history of disease. Counseling  Your health care provider may ask you questions about your:  Alcohol use.  Tobacco use.  Drug use.  Emotional well-being.  Home and relationship well-being.  Sexual activity.  Eating habits.  History of falls.  Memory and ability to understand (cognition).  Work and work Statistician. Screening  You may have the following tests or measurements:  Height, weight, and BMI.  Blood pressure.  Lipid and cholesterol levels. These may be checked every 5 years, or more frequently if you are over 74 years old.  Skin check.  Lung cancer screening. You may have this screening every year starting at age 37 if you have a 30-pack-year history of smoking and currently smoke or have quit within the past 15 years.  Fecal occult blood test (FOBT) of the stool. You may have this test every year starting at age 51.  Flexible sigmoidoscopy or colonoscopy. You may have a sigmoidoscopy every 5 years or a colonoscopy every 10 years starting at age 87.  Prostate cancer screening. Recommendations will vary depending on your family history and other risks.  Hepatitis C blood test.  Hepatitis B blood test.  Sexually transmitted disease (STD) testing.  Diabetes screening. This is done by checking your blood sugar (glucose) after you have not eaten for a while (fasting). You may have this done every 1-3 years.  Abdominal aortic aneurysm (AAA) screening. You may need this if you are a current or former smoker.  Osteoporosis. You may be screened starting at age 23 if you are at high risk. Talk with your health  care provider about your test results, treatment options, and if necessary, the need for more tests. Vaccines  Your health care provider may recommend certain vaccines, such as:  Influenza vaccine. This is recommended every year.  Tetanus, diphtheria, and acellular pertussis (Tdap, Td) vaccine.  You may need a Td booster every 10 years.  Zoster vaccine. You may need this after age 36.  Pneumococcal 13-valent conjugate (PCV13) vaccine. One dose is recommended after age 61.  Pneumococcal polysaccharide (PPSV23) vaccine. One dose is recommended after age 1. Talk to your health care provider about which screenings and vaccines you need and how often you need them. This information is not intended to replace advice given to you by your health care provider. Make sure you discuss any questions you have with your health care provider. Document Released: 01/07/2016 Document Revised: 08/30/2016 Document Reviewed: 10/12/2015 Elsevier Interactive Patient Education  2017 [place] Prevention in the Home Falls can cause injuries. They can happen to people of all ages. There are many things you can do to make your home safe and to help prevent falls. What can I do on the outside of my home?  Regularly fix the edges of walkways and driveways and fix any cracks.  Remove anything that might make you trip as you walk through a door, such as a raised step or threshold.  Trim any bushes or trees on the path to your home.  Use bright outdoor lighting.  Clear any walking paths of anything that might make someone trip, such as rocks or tools.  Regularly check to see if handrails are loose or broken. Make sure that both sides of any steps have handrails.  Any raised decks and porches should have guardrails on the edges.  Have any leaves, snow, or ice cleared regularly.  Use sand or salt on walking paths during winter.  Clean up any spills in your garage right away. This includes oil or grease spills. What can I do in the bathroom?  Use night lights.  Install grab bars by the toilet and in the tub and shower. Do not use towel bars as grab bars.  Use non-skid mats or decals in the tub or shower.  If you need to sit down in the shower, use a plastic, non-slip stool.  Keep the  floor dry. Clean up any water that spills on the floor as soon as it happens.  Remove soap buildup in the tub or shower regularly.  Attach bath mats securely with double-sided non-slip rug tape.  Do not have throw rugs and other things on the floor that can make you trip. What can I do in the bedroom?  Use night lights.  Make sure that you have a light by your bed that is easy to reach.  Do not use any sheets or blankets that are too big for your bed. They should not hang down onto the floor.  Have a firm chair that has side arms. You can use this for support while you get dressed.  Do not have throw rugs and other things on the floor that can make you trip. What can I do in the kitchen?  Clean up any spills right away.  Avoid walking on wet floors.  Keep items that you use a lot in easy-to-reach places.  If you need to reach something above you, use a strong step stool that has a grab bar.  Keep electrical cords out of the way.  Do not use floor  polish or wax that makes floors slippery. If you must use wax, use non-skid floor wax.  Do not have throw rugs and other things on the floor that can make you trip. What can I do with my stairs?  Do not leave any items on the stairs.  Make sure that there are handrails on both sides of the stairs and use them. Fix handrails that are broken or loose. Make sure that handrails are as long as the stairways.  Check any carpeting to make sure that it is firmly attached to the stairs. Fix any carpet that is loose or worn.  Avoid having throw rugs at the top or bottom of the stairs. If you do have throw rugs, attach them to the floor with carpet tape.  Make sure that you have a light switch at the top of the stairs and the bottom of the stairs. If you do not have them, ask someone to add them for you. What else can I do to help prevent falls?  Wear shoes that:  Do not have high heels.  Have rubber bottoms.  Are comfortable and fit  you well.  Are closed at the toe. Do not wear sandals.  If you use a stepladder:  Make sure that it is fully opened. Do not climb a closed stepladder.  Make sure that both sides of the stepladder are locked into place.  Ask someone to hold it for you, if possible.  Clearly mark and make sure that you can see:  Any grab bars or handrails.  First and last steps.  Where the edge of each step is.  Use tools that help you move around (mobility aids) if they are needed. These include:  Canes.  Walkers.  Scooters.  Crutches.  Turn on the lights when you go into a dark area. Replace any light bulbs as soon as they burn out.  Set up your furniture so you have a clear path. Avoid moving your furniture around.  If any of your floors are uneven, fix them.  If there are any pets around you, be aware of where they are.  Review your medicines with your doctor. Some medicines can make you feel dizzy. This can increase your chance of falling. Ask your doctor what other things that you can do to help prevent falls. This information is not intended to replace advice given to you by your health care provider. Make sure you discuss any questions you have with your health care provider. Document Released: 10/07/2009 Document Revised: 05/18/2016 Document Reviewed: 01/15/2015 Elsevier Interactive Patient Education  2017 [employer].

## 2021-05-03 ENCOUNTER — Encounter (INDEPENDENT_AMBULATORY_CARE_PROVIDER_SITE_OTHER): Payer: Self-pay | Admitting: *Deleted

## 2021-05-04 ENCOUNTER — Other Ambulatory Visit: Payer: Self-pay | Admitting: Family Medicine

## 2021-06-27 ENCOUNTER — Encounter: Payer: Self-pay | Admitting: Family Medicine

## 2021-06-29 ENCOUNTER — Telehealth: Payer: Medicare HMO | Admitting: Family Medicine

## 2021-06-29 ENCOUNTER — Other Ambulatory Visit: Payer: Self-pay

## 2021-06-29 DIAGNOSIS — N182 Chronic kidney disease, stage 2 (mild): Secondary | ICD-10-CM | POA: Diagnosis not present

## 2021-06-29 DIAGNOSIS — E1122 Type 2 diabetes mellitus with diabetic chronic kidney disease: Secondary | ICD-10-CM | POA: Diagnosis not present

## 2021-06-29 DIAGNOSIS — W57XXXA Bitten or stung by nonvenomous insect and other nonvenomous arthropods, initial encounter: Secondary | ICD-10-CM | POA: Diagnosis not present

## 2021-06-29 DIAGNOSIS — E782 Mixed hyperlipidemia: Secondary | ICD-10-CM | POA: Diagnosis not present

## 2021-06-29 MED ORDER — DOXYCYCLINE HYCLATE 100 MG PO TABS: 100.0000 mg | ORAL_TABLET | Freq: Two times a day (BID) | ORAL | 0 refills | Status: DC

## 2021-06-29 NOTE — Patient Instructions (Addendum)
Annual physical exam in office 09/30/2021, call if you need [place] before   Doxycycline is prescribed for presumed RMSF  Please try to reduce tick exposure by wearing protective clothing outside, examining yourself for ticks frequently  Thanks for choosing [facility], we consider it a privelige to serve you.  Labs and immunization overdue , he will be contacted

## 2021-06-29 NOTE — Progress Notes (Signed)
Virtual Visit via Telephone Note  I connected with [name] on 06/29/21 at  2:20 PM EDT by telephone and verified that I am speaking with the correct person using two identifiers.  Location: Patient: home Provider: office   I discussed the limitations, risks, security and privacy concerns of performing an evaluation and management service by telephone and the availability of in person appointments. I also discussed with the patient that there may be a patient responsible charge related to this service. The patient expressed understanding and agreed to proceed.   History of Present Illness:   multiple tick bites, in Spring, got one off as recent as yesterday Last week Friday develop headache , on Sunday had chills and has developed generalized body aches, states this feels like RMSF which he has had in the past Observations/Objective:  There were no vitals taken for this visit. Good communication with no confusion and intact memory. Alert and oriented x 3 No signs of respiratory distress during speech  Assessment and Plan: Tick bite of multiple sites Reports recurrent tick exposure over past 3 months, most recent one removed in the past 1 week. Symptoms concerning for RMSF, treated presumptively with doxycycline and educated re reducing exposure  Mixed hyperlipidemia Updated lab needed at/ before next visit.   Type 2 diabetes mellitus with stage 2 chronic kidney disease, without long-term current use of insulin ([place]) Updated lab needed at/ before next visit.    Follow Up Instructions:    I discussed the assessment and treatment plan with the patient. The patient was provided an opportunity to ask questions and all were answered. The patient agreed with the plan and demonstrated an understanding of the instructions.   The patient was advised to call back or seek an in-person evaluation if the symptoms worsen or if the condition fails to improve as anticipated.  I  provided  12 minutes of non-face-to-face time during this encounter.   Tula Nakayama, MD

## 2021-07-02 ENCOUNTER — Encounter: Payer: Self-pay | Admitting: Family Medicine

## 2021-07-02 ENCOUNTER — Telehealth: Payer: Self-pay | Admitting: Family Medicine

## 2021-07-02 DIAGNOSIS — W57XXXA Bitten or stung by nonvenomous insect and other nonvenomous arthropods, initial encounter: Secondary | ICD-10-CM | POA: Insufficient documentation

## 2021-07-02 NOTE — Assessment & Plan Note (Signed)
Reports recurrent tick exposure over past 3 months, most recent one removed in the past 1 week. Symptoms concerning for RMSF, treated presumptively with doxycycline and educated re reducing exposure

## 2021-07-02 NOTE — Assessment & Plan Note (Signed)
Updated lab needed at/ before next visit.   

## 2021-07-02 NOTE — Telephone Encounter (Signed)
As follow up to recent visit, he is overdue a lot of labs and also needs vaccines Can he come and get fasting lipid, cmp and EGFr, HBa1C, microalb, CBC, TSH and vit D  this week, also offer a pneumonia vaccine 23  and if he agrees arrange for him to get it the day he comes for labs Needs covid booster alo, wait about 2 weeks after Pneumonia 23 vaccine if h takes it ?? Pls ask!

## 2021-07-04 NOTE — Telephone Encounter (Signed)
Left message

## 2021-07-12 ENCOUNTER — Other Ambulatory Visit: Payer: Self-pay

## 2021-07-12 DIAGNOSIS — I714 Abdominal aortic aneurysm, without rupture, unspecified: Secondary | ICD-10-CM

## 2021-07-12 DIAGNOSIS — E559 Vitamin D deficiency, unspecified: Secondary | ICD-10-CM

## 2021-07-12 DIAGNOSIS — N182 Chronic kidney disease, stage 2 (mild): Secondary | ICD-10-CM

## 2021-07-12 DIAGNOSIS — K219 Gastro-esophageal reflux disease without esophagitis: Secondary | ICD-10-CM

## 2021-07-12 DIAGNOSIS — E782 Mixed hyperlipidemia: Secondary | ICD-10-CM

## 2021-07-12 DIAGNOSIS — E669 Obesity, unspecified: Secondary | ICD-10-CM

## 2021-07-12 NOTE — Telephone Encounter (Signed)
Pt informed

## 2021-07-14 ENCOUNTER — Other Ambulatory Visit: Payer: Self-pay | Admitting: "Endocrinology

## 2021-07-14 DIAGNOSIS — N401 Enlarged prostate with lower urinary tract symptoms: Secondary | ICD-10-CM

## 2021-07-15 NOTE — Progress Notes (Signed)
Sounds good to me

## 2021-07-16 LAB — CBC WITH DIFFERENTIAL/PLATELET
Basophils Absolute: 0.1 10*3/uL (ref 0.0–0.2)
Basos: 1 %
EOS (ABSOLUTE): 0.2 10*3/uL (ref 0.0–0.4)
Eos: 5 %
Hematocrit: 40.1 % (ref 37.5–51.0)
Hemoglobin: 13.6 g/dL (ref 13.0–17.7)
Immature Grans (Abs): 0 10*3/uL (ref 0.0–0.1)
Immature Granulocytes: 0 %
Lymphocytes Absolute: 0.9 10*3/uL (ref 0.7–3.1)
Lymphs: 21 %
MCH: 31.6 pg (ref 26.6–33.0)
MCHC: 33.9 g/dL (ref 31.5–35.7)
MCV: 93 fL (ref 79–97)
Monocytes Absolute: 0.4 10*3/uL (ref 0.1–0.9)
Monocytes: 10 %
Neutrophils Absolute: 2.6 10*3/uL (ref 1.4–7.0)
Neutrophils: 63 %
Platelets: 167 10*3/uL (ref 150–450)
RBC: 4.31 x10E6/uL (ref 4.14–5.80)
RDW: 12.4 % (ref 11.6–15.4)
WBC: 4.2 10*3/uL (ref 3.4–10.8)

## 2021-07-16 LAB — MICROALBUMIN, URINE: Microalbumin, Urine: 14.5 ug/mL

## 2021-07-16 LAB — CMP14+EGFR
ALT: 16 IU/L (ref 0–44)
AST: 17 IU/L (ref 0–40)
Albumin/Globulin Ratio: 1.5 (ref 1.2–2.2)
Albumin: 4.1 g/dL (ref 3.8–4.8)
Alkaline Phosphatase: 56 IU/L (ref 44–121)
BUN/Creatinine Ratio: 12 (ref 10–24)
BUN: 14 mg/dL (ref 8–27)
Bilirubin Total: 0.5 mg/dL (ref 0.0–1.2)
CO2: 23 mmol/L (ref 20–29)
Calcium: 8.9 mg/dL (ref 8.6–10.2)
Chloride: 104 mmol/L (ref 96–106)
Creatinine, Ser: 1.17 mg/dL (ref 0.76–1.27)
Globulin, Total: 2.7 g/dL (ref 1.5–4.5)
Glucose: 139 mg/dL — ABNORMAL HIGH (ref 65–99)
Potassium: 4.8 mmol/L (ref 3.5–5.2)
Sodium: 141 mmol/L (ref 134–144)
Total Protein: 6.8 g/dL (ref 6.0–8.5)
eGFR: 68 mL/min/{1.73_m2} (ref >59)

## 2021-07-16 LAB — TSH: TSH: 2.38 u[IU]/mL (ref 0.450–4.500)

## 2021-07-16 LAB — LIPID PANEL
Chol/HDL Ratio: 5.1 ratio — ABNORMAL HIGH (ref 0.0–5.0)
Cholesterol, Total: 198 mg/dL (ref 100–199)
HDL: 39 mg/dL — ABNORMAL LOW (ref >39)
LDL Chol Calc (NIH): 112 mg/dL — ABNORMAL HIGH (ref 0–99)
Triglycerides: 268 mg/dL — ABNORMAL HIGH (ref 0–149)
VLDL Cholesterol Cal: 47 mg/dL — ABNORMAL HIGH (ref 5–40)

## 2021-07-16 LAB — HEMOGLOBIN A1C
Est. average glucose Bld gHb Est-mCnc: 134 mg/dL
Hgb A1c MFr Bld: 6.3 % — ABNORMAL HIGH (ref 4.8–5.6)

## 2021-07-16 LAB — VITAMIN D 25 HYDROXY (VIT D DEFICIENCY, FRACTURES): Vit D, 25-Hydroxy: 34.3 ng/mL (ref 30.0–100.0)

## 2021-07-18 ENCOUNTER — Other Ambulatory Visit: Payer: Self-pay

## 2021-07-18 ENCOUNTER — Encounter: Payer: Self-pay | Admitting: Nurse Practitioner

## 2021-07-18 DIAGNOSIS — E782 Mixed hyperlipidemia: Secondary | ICD-10-CM

## 2021-07-18 DIAGNOSIS — N401 Enlarged prostate with lower urinary tract symptoms: Secondary | ICD-10-CM

## 2021-07-18 MED ORDER — TAMSULOSIN HCL 0.4 MG PO CAPS: 0.4000 mg | ORAL_CAPSULE | Freq: Two times a day (BID) | ORAL | 0 refills | Status: DC

## 2021-07-18 MED ORDER — SIMVASTATIN 20 MG PO TABS: ORAL_TABLET | ORAL | 3 refills | Status: DC

## 2021-07-19 NOTE — Progress Notes (Signed)
Left message to call and schedule lab follow up to go over recent labs and medications.

## 2021-07-21 ENCOUNTER — Telehealth: Payer: Self-pay | Admitting: Family Medicine

## 2021-08-09 ENCOUNTER — Other Ambulatory Visit: Payer: Self-pay

## 2021-08-09 ENCOUNTER — Encounter: Payer: Self-pay | Admitting: Nurse Practitioner

## 2021-08-09 ENCOUNTER — Ambulatory Visit (INDEPENDENT_AMBULATORY_CARE_PROVIDER_SITE_OTHER): Payer: Medicare HMO | Admitting: Nurse Practitioner

## 2021-08-09 DIAGNOSIS — R030 Elevated blood-pressure reading, without diagnosis of hypertension: Secondary | ICD-10-CM | POA: Diagnosis not present

## 2021-08-09 DIAGNOSIS — E1122 Type 2 diabetes mellitus with diabetic chronic kidney disease: Secondary | ICD-10-CM | POA: Diagnosis not present

## 2021-08-09 DIAGNOSIS — E782 Mixed hyperlipidemia: Secondary | ICD-10-CM | POA: Diagnosis not present

## 2021-08-09 DIAGNOSIS — N182 Chronic kidney disease, stage 2 (mild): Secondary | ICD-10-CM

## 2021-08-09 MED ORDER — SIMVASTATIN 40 MG PO TABS: ORAL_TABLET | ORAL | 3 refills | Status: DC

## 2021-08-09 NOTE — Assessment & Plan Note (Signed)
Lab Results  Component Value Date   CHOL 198 07/14/2021   HDL 39 (L) 07/14/2021   LDLCALC 112 (H) 07/14/2021   TRIG 268 (H) 07/14/2021   CHOLHDL 5.1 (H) 07/14/2021   -Recommend fish oil 3g daily for trigs -INCREASE zocor to 40 mg -LDL goal < 70 d/t DM

## 2021-08-09 NOTE — Assessment & Plan Note (Signed)
BP Readings from Last 3 Encounters:  08/09/21 (!) 148/87  12/23/20 135/87  09/29/20 130/84   -if BP elevated at next visit will consider adding ACEi/ARB since that will provide renal protection as well

## 2021-08-09 NOTE — Assessment & Plan Note (Signed)
Lab Results  Component Value Date   HGBA1C 6.3 (H) 07/14/2021   -continue metformin -on statin, but not currently on ACEi/ARB

## 2021-08-09 NOTE — Patient Instructions (Signed)
Please have fasting labs drawn 2-3 days prior to your next appointment so we can discuss the results during your office visit.

## 2021-08-09 NOTE — Progress Notes (Signed)
Established Patient Office Visit  Subjective:  Patient ID: Andres Robinson, male    DOB: [birth year]  Age: 65 y.o. MRN: [id number]  CC:  Chief Complaint  Patient presents with   Follow-up    labs    HPI Andres Robinson presents for lab follow-up. He has hx of HLD and diabetes. He states CBG has been creeping up, but he has been eating more tomato sandwiches, so his carb consumption has increased. Denies adverse medication effects.  Past Medical History:  Diagnosis Date   Acute bacterial sinusitis 03/26/2020   DDD (degenerative disc disease), cervical    DDD (degenerative disc disease), lumbar    High cholesterol    Normal cardiac stress test 2005   RMSF [facility] spotted fever) 2013   Type 2 diabetes mellitus with stage 2 chronic kidney disease, without long-term current use of insulin ([place]) 09/16/2019    Past Surgical History:  Procedure Laterality Date   CATARACT EXTRACTION Bilateral    COLONOSCOPY     ESOPHAGOGASTRODUODENOSCOPY N/A 01/14/2014   Procedure: ESOPHAGOGASTRODUODENOSCOPY (EGD);  Surgeon: Rogene Houston, MD;  Location: [facility];  Service: Endoscopy;  Laterality: N/A;  72    Family History  Problem Relation Age of Onset   Heart disease Father    Diabetes Father    Hyperlipidemia Father    Hypertension Father    AAA (abdominal aortic aneurysm) Father    Peripheral vascular disease Father        amputation   Hypertension Brother    Heart attack Brother     Social History   Socioeconomic History   Marital status: Married    Spouse name: Tena    Number of children: 0   Years of education: Not on file   Highest education level: Some college, no degree  Occupational History   Not on file  Tobacco Use   Smoking status: Former   Smokeless tobacco: Never   Tobacco comments:    quit in 1982  Substance and Sexual Activity   Alcohol use: Yes    Comment: 2-3 glasses wine a week   Drug use: No   Sexual activity: Yes    Partners: Female  Other  Topics Concern   Not on file  Social History Narrative   Lives with Tena married since 1981      Pets: 1 boston terrier -deaf and one eye: Ladell Heads      Enjoys: hunt and being outside      Diet: eat all food groups   Caffeine: 4 cups of coffee   Water: 4 glasses a day      Wears seat belt    Smoke detectors    Does not use phone while driving   Social Determinants of Radio broadcast assistant Strain: Low Risk    Difficulty of Paying Living Expenses: Not hard at all  Food Insecurity: No Food Insecurity   Worried About Charity fundraiser in the Last Year: Never true   Arboriculturist in the Last Year: Never true  Transportation Needs: No Transportation Needs   Lack of Transportation (Medical): No   Lack of Transportation (Non-Medical): No  Physical Activity: Sufficiently Active   Days of Exercise per Week: 7 days   Minutes of Exercise per Session: 30 min  Stress: No Stress Concern Present   Feeling of Stress : Not at all  Social Connections: Moderately Integrated   Frequency of Communication with Friends and Family: Twice  a week   Frequency of Social Gatherings with Friends and Family: Three times a week   Attends Religious Services: 1 to 4 times per year   Active Member of Clubs or Organizations: No   Attends Archivist Meetings: Never   Marital Status: Married  Human resources officer Violence: Not At Risk   Fear of Current or Ex-Partner: No   Emotionally Abused: No   Physically Abused: No   Sexually Abused: No    Outpatient Medications Prior to Visit  Medication Sig Dispense Refill   metFORMIN (GLUCOPHAGE) 500 MG tablet Take 1 tablet (500 mg total) by mouth daily with breakfast. 90 tablet 3   ONETOUCH ULTRA test strip CHECK BLOOD SUGAR ONCE ANDAY 100 each 2   tamsulosin (FLOMAX) 0.4 MG CAPS capsule Take 1 capsule (0.4 mg total) by mouth 2 (two) times daily. 180 capsule 0   simvastatin (ZOCOR) 20 MG tablet TAKE 1 TABLET ONCE DAILY AT 6PM 30 tablet 3    doxycycline (VIBRA-TABS) 100 MG tablet Take 1 tablet (100 mg total) by mouth 2 (two) times daily. 20 tablet 0   glucosamine-chondroitin 500-400 MG tablet Take 1 tablet by mouth 3 (three) times daily. (Patient not taking: Reported on 08/09/2021)     melatonin 5 MG TABS Take 5 mg by mouth at bedtime. (Patient not taking: Reported on 08/09/2021)     No facility-administered medications prior to visit.    Allergies  Allergen Reactions   Penicillins Other (See Comments)    Childhood Reaction     ROS Review of Systems  Constitutional: Negative.   Respiratory: Negative.    Cardiovascular: Negative.   Musculoskeletal: Negative.   Psychiatric/Behavioral: Negative.       Objective:    Physical Exam Constitutional:      Appearance: Normal appearance.  Cardiovascular:     Rate and Rhythm: Normal rate and regular rhythm.     Pulses: Normal pulses.     Heart sounds: Normal heart sounds.  Pulmonary:     Effort: Pulmonary effort is normal.     Breath sounds: Normal breath sounds.  Musculoskeletal:        General: Normal range of motion.  Neurological:     Mental Status: He is alert.  Psychiatric:        Mood and Affect: Mood normal.        Behavior: Behavior normal.        Thought Content: Thought content normal.        Judgment: Judgment normal.    BP (!) 148/87 (BP Location: Left Arm, Patient Position: Sitting, Cuff Size: Large)   Pulse 74   Temp 98.2 F (36.8 C) (Oral)   Ht _0  (1.803 m)   Wt 229 lb (103.9 kg)   SpO2 95%   BMI 31.94 kg/m  Wt Readings from Last 3 Encounters:  08/09/21 229 lb (103.9 kg)  12/23/20 226 lb 9.6 oz (102.8 kg)  12/13/20 226 lb (102.5 kg)     Health Maintenance Due  Topic Date Due   COLONOSCOPY (Pts 45-55yr Insurance coverage will need to be confirmed)  Never done   Zoster Vaccines- Shingrix (1 of 2) Never done   PNA vac Low Risk Adult (1 of 2 - PCV13) Never done   INFLUENZA VACCINE  07/25/2021    There are no preventive care  reminders to display for this patient.  Lab Results  Component Value Date   TSH 2.380 07/14/2021   Lab Results  Component Value Date  WBC 4.2 07/14/2021   HGB 13.6 07/14/2021   HCT 40.1 07/14/2021   MCV 93 07/14/2021   PLT 167 07/14/2021   Lab Results  Component Value Date   NA 141 07/14/2021   K 4.8 07/14/2021   CO2 23 07/14/2021   GLUCOSE 139 (H) 07/14/2021   BUN 14 07/14/2021   CREATININE 1.17 07/14/2021   BILITOT 0.5 07/14/2021   ALKPHOS 56 07/14/2021   AST 17 07/14/2021   ALT 16 07/14/2021   PROT 6.8 07/14/2021   ALBUMIN 4.1 07/14/2021   CALCIUM 8.9 07/14/2021   EGFR 68 07/14/2021   Lab Results  Component Value Date   CHOL 198 07/14/2021   Lab Results  Component Value Date   HDL 39 (L) 07/14/2021   Lab Results  Component Value Date   LDLCALC 112 (H) 07/14/2021   Lab Results  Component Value Date   TRIG 268 (H) 07/14/2021   Lab Results  Component Value Date   CHOLHDL 5.1 (H) 07/14/2021   Lab Results  Component Value Date   HGBA1C 6.3 (H) 07/14/2021      Assessment & Plan:   Problem List Items Addressed This Visit       Endocrine   Type 2 diabetes mellitus with stage 2 chronic kidney disease, without long-term current use of insulin ([place])    Lab Results  Component Value Date   HGBA1C 6.3 (H) 07/14/2021  -continue metformin -on statin, but not currently on ACEi/ARB      Relevant Medications   simvastatin (ZOCOR) 40 MG tablet   Other Relevant Orders   CBC with Differential/Platelet   CMP14+EGFR   Lipid Panel With LDL/HDL Ratio   Hemoglobin A1c   Microalbumin / creatinine urine ratio     Other   Mixed hyperlipidemia    Lab Results  Component Value Date   CHOL 198 07/14/2021   HDL 39 (L) 07/14/2021   LDLCALC 112 (H) 07/14/2021   TRIG 268 (H) 07/14/2021   CHOLHDL 5.1 (H) 07/14/2021  -Recommend fish oil 3g daily for trigs -INCREASE zocor to 40 mg -LDL goal < 70 d/t DM      Relevant Medications   simvastatin (ZOCOR) 40 MG  tablet   Other Relevant Orders   CMP14+EGFR   Lipid Panel With LDL/HDL Ratio   Elevated BP without diagnosis of hypertension    BP Readings from Last 3 Encounters:  08/09/21 (!) 148/87  12/23/20 135/87  09/29/20 130/84  -if BP elevated at next visit will consider adding ACEi/ARB since that will provide renal protection as well      Relevant Orders   CBC with Differential/Platelet   CMP14+EGFR   Lipid Panel With LDL/HDL Ratio    Meds ordered this encounter  Medications   simvastatin (ZOCOR) 40 MG tablet    Sig: TAKE 1 TABLET ONCE DAILY AT 6PM    Dispense:  90 tablet    Refill:  3     Follow-up: Return in about 3 months (around 11/09/2021) for Lab follow-up (HLD, DM).    Noreene Larsson, NP

## 2021-09-02 ENCOUNTER — Other Ambulatory Visit (INDEPENDENT_AMBULATORY_CARE_PROVIDER_SITE_OTHER): Payer: Self-pay

## 2021-09-02 DIAGNOSIS — Z1211 Encounter for screening for malignant neoplasm of colon: Secondary | ICD-10-CM

## 2021-09-12 ENCOUNTER — Telehealth (INDEPENDENT_AMBULATORY_CARE_PROVIDER_SITE_OTHER): Payer: Self-pay

## 2021-09-12 NOTE — Telephone Encounter (Signed)
Referring MD/PCP: Cherly Beach  Procedure: Tcs  Reason/Indication:  Screening  Has patient had this procedure before?  no  If so, when, by whom and where?    Is there a family history of colon cancer?  no  Who?  What age when diagnosed?    Is patient diabetic? If yes, Type 1 or Type 2   yes Type 2      Does patient have prosthetic heart valve or mechanical valve?  no  Do you have a pacemaker/defibrillator?  no  Has patient ever had endocarditis/atrial fibrillation? no  Does patient use oxygen? no  Has patient had joint replacement within last 12 months?  no  Is patient constipated or do they take laxatives? no  Does patient have a history of alcohol/drug use?  no  Have you had a stroke/heart attack last 6 mths? no  Do you take medicine for weight loss?  no  For male patients,: do you still have your menstrual cycle? N/A  Is patient on blood thinner such as Coumadin, Plavix and/or Aspirin? no  Medications: Tamsulosin 0.4 mg bid, Metformin 500 mg daily, Simvastatin 20 mg daily  Allergies: Pcn  Medication Adjustment per Dr Jenetta Downer Hold Metformin the evening prior and the morning of procedure  Procedure date & time: 10/12/21 10:45

## 2021-09-13 ENCOUNTER — Encounter (INDEPENDENT_AMBULATORY_CARE_PROVIDER_SITE_OTHER): Payer: Self-pay

## 2021-09-13 ENCOUNTER — Telehealth (INDEPENDENT_AMBULATORY_CARE_PROVIDER_SITE_OTHER): Payer: Self-pay

## 2021-09-13 MED ORDER — PEG 3350-KCL-NA BICARB-NACL 420 G PO SOLR: 4000.0000 mL | ORAL | 0 refills | Status: DC

## 2021-09-13 NOTE — Telephone Encounter (Signed)
LeighAnn Meher Kucinski, CMA  

## 2021-09-22 NOTE — Telephone Encounter (Signed)
Ok to schedule.  Thanks,  Coleston Dirosa Castaneda Mayorga, MD Gastroenterology and Hepatology [facility]  

## 2021-10-12 ENCOUNTER — Other Ambulatory Visit: Payer: Self-pay

## 2021-10-12 ENCOUNTER — Encounter: Payer: Medicare HMO | Admitting: Nurse Practitioner

## 2021-10-12 ENCOUNTER — Ambulatory Visit (HOSPITAL_COMMUNITY): Payer: Medicare HMO | Admitting: Anesthesiology

## 2021-10-12 ENCOUNTER — Ambulatory Visit (HOSPITAL_COMMUNITY)
Admission: RE | Admit: 2021-10-12 | Discharge: 2021-10-12 | Disposition: A | Discharge status: Home / Self Care | Payer: Medicare HMO | Attending: Gastroenterology | Admitting: Gastroenterology

## 2021-10-12 ENCOUNTER — Encounter (HOSPITAL_COMMUNITY): Payer: Self-pay | Admitting: Gastroenterology

## 2021-10-12 ENCOUNTER — Encounter (HOSPITAL_COMMUNITY)
Admission: RE | Disposition: A | Discharge status: Home / Self Care | Payer: Self-pay | Source: Home / Self Care | Attending: Gastroenterology

## 2021-10-12 DIAGNOSIS — D122 Benign neoplasm of ascending colon: Secondary | ICD-10-CM | POA: Diagnosis not present

## 2021-10-12 DIAGNOSIS — Z880 Allergy status to penicillin: Secondary | ICD-10-CM | POA: Diagnosis not present

## 2021-10-12 DIAGNOSIS — N182 Chronic kidney disease, stage 2 (mild): Secondary | ICD-10-CM | POA: Insufficient documentation

## 2021-10-12 DIAGNOSIS — E1122 Type 2 diabetes mellitus with diabetic chronic kidney disease: Secondary | ICD-10-CM | POA: Insufficient documentation

## 2021-10-12 DIAGNOSIS — Z87891 Personal history of nicotine dependence: Secondary | ICD-10-CM | POA: Diagnosis not present

## 2021-10-12 DIAGNOSIS — Z79899 Other long term (current) drug therapy: Secondary | ICD-10-CM | POA: Insufficient documentation

## 2021-10-12 DIAGNOSIS — Z7984 Long term (current) use of oral hypoglycemic drugs: Secondary | ICD-10-CM | POA: Insufficient documentation

## 2021-10-12 DIAGNOSIS — D123 Benign neoplasm of transverse colon: Secondary | ICD-10-CM | POA: Diagnosis not present

## 2021-10-12 DIAGNOSIS — E785 Hyperlipidemia, unspecified: Secondary | ICD-10-CM | POA: Insufficient documentation

## 2021-10-12 DIAGNOSIS — K219 Gastro-esophageal reflux disease without esophagitis: Secondary | ICD-10-CM | POA: Insufficient documentation

## 2021-10-12 DIAGNOSIS — D120 Benign neoplasm of cecum: Secondary | ICD-10-CM | POA: Insufficient documentation

## 2021-10-12 DIAGNOSIS — E7800 Pure hypercholesterolemia, unspecified: Secondary | ICD-10-CM | POA: Insufficient documentation

## 2021-10-12 DIAGNOSIS — Z1211 Encounter for screening for malignant neoplasm of colon: Secondary | ICD-10-CM | POA: Diagnosis present

## 2021-10-12 HISTORY — PX: POLYPECTOMY: SHX149

## 2021-10-12 HISTORY — PX: COLONOSCOPY WITH PROPOFOL: SHX5780

## 2021-10-12 LAB — HM COLONOSCOPY

## 2021-10-12 LAB — GLUCOSE, CAPILLARY: Glucose-Capillary: 95 mg/dL (ref 70–99)

## 2021-10-12 SURGERY — COLONOSCOPY WITH PROPOFOL
Anesthesia: General

## 2021-10-12 MED ORDER — LACTATED RINGERS IV SOLN: INTRAVENOUS | Status: DC

## 2021-10-12 MED ORDER — PROPOFOL 10 MG/ML IV BOLUS
INTRAVENOUS | Status: DC | PRN
  Administered 2021-10-12: 100 mg via INTRAVENOUS

## 2021-10-12 MED ORDER — LIDOCAINE HCL 1 % IJ SOLN
INTRAMUSCULAR | Status: DC | PRN
Start: 2021-10-12 — End: 2021-10-12
  Administered 2021-10-12: 50 mg via INTRADERMAL

## 2021-10-12 MED ORDER — PROPOFOL 500 MG/50ML IV EMUL
INTRAVENOUS | Status: DC | PRN
  Administered 2021-10-12: 150 ug/kg/min via INTRAVENOUS

## 2021-10-12 NOTE — Anesthesia Postprocedure Evaluation (Signed)
Anesthesia Post Note  Patient: Andres Robinson  Procedure(s) Performed: COLONOSCOPY WITH PROPOFOL POLYPECTOMY INTESTINAL  Patient location during evaluation: Endoscopy Anesthesia Type: General Level of consciousness: awake and alert Pain management: pain level controlled Vital Signs Assessment: post-procedure vital signs reviewed and stable Respiratory status: spontaneous breathing Cardiovascular status: blood pressure returned to baseline and stable Postop Assessment: no apparent nausea or vomiting Anesthetic complications: no   No notable events documented.   Last Vitals:  Vitals:   10/12/21 1001  BP: (!) 151/98  Pulse: 62  Resp: (!) 21  Temp: 36.6 C  SpO2: 100%    Last Pain:  Vitals:   10/12/21 1045  TempSrc:   PainSc: 0-No pain                 Lus Kriegel

## 2021-10-12 NOTE — Anesthesia Preprocedure Evaluation (Signed)
Anesthesia Evaluation  Patient identified by MRN, date of birth, ID band Patient awake    Reviewed: Allergy & Precautions, H&P , NPO status , Patient's Chart, lab work & pertinent test results, reviewed documented beta blocker date and time   Airway Mallampati: II  TM Distance: >3 FB Neck ROM: full    Dental no notable dental hx.    Pulmonary neg pulmonary ROS, former smoker,    Pulmonary exam normal breath sounds clear to auscultation       Cardiovascular Exercise Tolerance: Good negative cardio ROS   Rhythm:regular Rate:Normal     Neuro/Psych negative neurological ROS  negative psych ROS   GI/Hepatic Neg liver ROS, GERD  Medicated,  Endo/Other  negative endocrine ROSdiabetes, Type 2  Renal/GU Renal disease  negative genitourinary   Musculoskeletal   Abdominal   Peds  Hematology negative hematology ROS (+)   Anesthesia Other Findings   Reproductive/Obstetrics negative OB ROS                             Anesthesia Physical Anesthesia Plan  ASA: 2  Anesthesia Plan: General   Post-op Pain Management:    Induction:   PONV Risk Score and Plan: Propofol infusion  Airway Management Planned:   Additional Equipment:   Intra-op Plan:   Post-operative Plan:   Informed Consent: I have reviewed the patients History and Physical, chart, labs and discussed the procedure including the risks, benefits and alternatives for the proposed anesthesia with the patient or authorized representative who has indicated his/her understanding and acceptance.     Dental Advisory Given  Plan Discussed with: CRNA  Anesthesia Plan Comments:         Anesthesia Quick Evaluation

## 2021-10-12 NOTE — Op Note (Signed)
[facility] Patient Name: Andres Robinson Procedure Date: 10/12/2021 10:37 AM MRN: [id number] Date of Birth: 06-25-1956 Attending MD: Maylon Peppers ,  CSN: [id number] Age: 65 Admit Type: Outpatient Procedure:                Colonoscopy Indications:              Screening for colorectal malignant neoplasm Providers:                Maylon Peppers, [place] Page, [place] Risa Grill, Technician Referring MD:              Medicines:                Monitored Anesthesia Care Complications:            No immediate complications. Estimated Blood Loss:     Estimated blood loss: none. Procedure:                Pre-Anesthesia Assessment:                           - Prior to the procedure, a History and Physical                            was performed, and patient medications, allergies                            and sensitivities were reviewed. The patient's                            tolerance of previous anesthesia was reviewed.                           - The risks and benefits of the procedure and the                            sedation options and risks were discussed with the                            patient. All questions were answered and informed                            consent was obtained.                           - ASA Grade Assessment: II - A patient with mild                            systemic disease.                           After obtaining informed consent, the colonoscope                            was passed under direct vision. Throughout the  procedure, the patient's blood pressure, pulse, and                            oxygen saturations were monitored continuously. The                            [id number]-HQ190L ([id number]) scope was introduced through                            the anus and advanced to the the terminal ileum.                            The colonoscopy was performed without difficulty.                             The patient tolerated the procedure well. The                            quality of the bowel preparation was good. Scope In: 10:50:12 AM Scope Out: 11:20:00 AM Scope Withdrawal Time: 0 hours 22 minutes 4 seconds  Total Procedure Duration: 0 hours 29 minutes 48 seconds  Findings:      The perianal and digital rectal examinations were normal.      Four sessile polyps were found in the transverse colon, ascending colon       and cecum. The polyps were 3 to 8 mm in size. These polyps were removed       with a cold snare. Resection and retrieval were complete.      Two semi-sessile polyps were found in the ascending colon. The polyps       were 1 to 2 mm in size. These polyps were removed with a cold biopsy       forceps. Resection and retrieval were complete.      The retroflexed view of the distal rectum and anal verge was normal and       showed no anal or rectal abnormalities. Impression:               - Four 3 to 8 mm polyps in the transverse colon, in                            the ascending colon and in the cecum, removed with                            a cold snare. Resected and retrieved.                           - Two 1 to 2 mm polyps in the ascending colon,                            removed with a cold biopsy forceps. Resected and                            retrieved.                           -  The distal rectum and anal verge are normal on                            retroflexion view. Moderate Sedation:      Per Anesthesia Care Recommendation:           - Discharge patient to home (ambulatory).                           - Resume previous diet.                           - Await pathology results.                           - Repeat colonoscopy for surveillance based on                            pathology results. Procedure Code(s):        --- Professional ---                           [phone], Colonoscopy, flexible; with removal of                             tumor(s), polyp(s), or other lesion(s) by snare                            technique                           45380, 21, Colonoscopy, flexible; with biopsy,                            single or multiple Diagnosis Code(s):        --- Professional ---                           Z12.11, Encounter for screening for malignant                            neoplasm of colon                           K63.5, Polyp of colon CPT copyright 2019 American Medical Association. All rights reserved. The codes documented in this report are preliminary and upon coder review may  be revised to meet current compliance requirements. Maylon Peppers, MD Maylon Peppers,  10/12/2021 11:24:18 AM This report has been signed electronically. Number of Addenda: 0

## 2021-10-12 NOTE — H&P (Signed)
Andres Robinson is an 65 y.o. male.   Chief Complaint: screening colonoscopy HPI: 66 y/o M with PMH diabetes, degenerative disc disease, hyperlipidemia, coming for screening colonoscopy.  Patient reported that his last colonoscopy was performed close to 20 years ago, states that it was normal, no reports are available.  The patient denies having any complaints such as melena, hematochezia, abdominal pain or distention, change in her bowel movement consistency or frequency, no changes in her weight recently.  No family history of colorectal cancer.   Past Medical History:  Diagnosis Date   Acute bacterial sinusitis 03/26/2020   DDD (degenerative disc disease), cervical    DDD (degenerative disc disease), lumbar    High cholesterol    Normal cardiac stress test 2005   RMSF [facility] spotted fever) 2013   Type 2 diabetes mellitus with stage 2 chronic kidney disease, without long-term current use of insulin ([place]) 09/16/2019    Past Surgical History:  Procedure Laterality Date   CATARACT EXTRACTION Bilateral    COLONOSCOPY     ESOPHAGOGASTRODUODENOSCOPY N/A 01/14/2014   Procedure: ESOPHAGOGASTRODUODENOSCOPY (EGD);  Surgeon: Rogene Houston, MD;  Location: [facility];  Service: Endoscopy;  Laterality: N/A;  51    Family History  Problem Relation Age of Onset   Heart disease Father    Diabetes Father    Hyperlipidemia Father    Hypertension Father    AAA (abdominal aortic aneurysm) Father    Peripheral vascular disease Father        amputation   Hypertension Brother    Heart attack Brother    Social History:  reports that he has quit smoking. He has never used smokeless tobacco. He reports current alcohol use of about 3.0 standard drinks per week. He reports that he does not use drugs.  Allergies:  Allergies  Allergen Reactions   Penicillins Other (See Comments)    Childhood Reaction     Medications Prior to Admission  Medication Sig Dispense Refill   acetaminophen  (TYLENOL) 650 MG CR tablet Take 1,300 mg by mouth every 8 (eight) hours as needed for pain.     diphenhydramine-acetaminophen (TYLENOL PM) 25-500 MG TABS tablet Take 1 tablet by mouth at bedtime as needed (sleep).     GLUCOSAMINE-CHONDROITIN PO Take 2 tablets by mouth daily with breakfast.     metFORMIN (GLUCOPHAGE) 500 MG tablet Take 1 tablet (500 mg total) by mouth daily with breakfast. 90 tablet 3   Omega-3 Fatty Acids (FISH OIL ULTRA) 1400 MG CAPS Take 1,400 mg by mouth daily.     oxymetazoline (AFRIN) 0.05 % nasal spray Place 1 spray into both nostrils 2 (two) times daily as needed for congestion.     polyethylene glycol-electrolytes (TRILYTE) 420 g solution Take 4,000 mLs by mouth as directed. 4000 mL 0   simvastatin (ZOCOR) 20 MG tablet Take 20 mg by mouth daily at 6 PM.     simvastatin (ZOCOR) 40 MG tablet TAKE 1 TABLET ONCE DAILY AT 6PM 90 tablet 3   tamsulosin (FLOMAX) 0.4 MG CAPS capsule Take 1 capsule (0.4 mg total) by mouth 2 (two) times daily. 180 capsule 0   ONETOUCH ULTRA test strip CHECK BLOOD SUGAR ONCE ANDAY 100 each 2    Results for orders placed or performed during the hospital encounter of 10/12/21 (from the past 48 hour(s))  Glucose, capillary     Status: None   Collection Time: 10/12/21 10:08 AM  Result Value Ref Range   Glucose-Capillary 95 70 -  99 mg/dL    Comment: Glucose reference range applies only to samples taken after fasting for at least 8 hours.   No results found.  Review of Systems  Constitutional: Negative.   HENT: Negative.    Eyes: Negative.   Respiratory: Negative.    Cardiovascular: Negative.   Gastrointestinal: Negative.   Endocrine: Negative.   Genitourinary: Negative.   Musculoskeletal: Negative.   Skin: Negative.   Allergic/Immunologic: Negative.   Neurological: Negative.   Hematological: Negative.   Psychiatric/Behavioral: Negative.     Blood pressure (!) 151/98, pulse 62, temperature 97.9 F (36.6 C), temperature source Oral, resp.  rate (!) 21, height 5\' 11"  (1.803 m), weight 95.3 kg, SpO2 100 %. Physical Exam  GENERAL: The patient is AO x3, in no acute distress. HEENT: Head is normocephalic and atraumatic. EOMI are intact. Mouth is well hydrated and without lesions. NECK: Supple. No masses LUNGS: Clear to auscultation. No presence of rhonchi/wheezing/rales. Adequate chest expansion HEART: RRR, normal s1 and s2. ABDOMEN: Soft, nontender, no guarding, no peritoneal signs, and nondistended. BS +. No masses. EXTREMITIES: Without any cyanosis, clubbing, rash, lesions or edema. NEUROLOGIC: AOx3, no focal motor deficit. SKIN: no jaundice, no rashes  Assessment/Plan 65 y/o M with PMH diabetes, degenerative disc disease, hyperlipidemia, coming for screening colonoscopy.  The patient is at average risk for colorectal cancer.  We will proceed with colonoscopy today.   Harvel Quale, MD 10/12/2021, 10:37 AM

## 2021-10-12 NOTE — Transfer of Care (Signed)
Immediate Anesthesia Transfer of Care Note  Patient: Andres Robinson  Procedure(s) Performed: COLONOSCOPY WITH PROPOFOL POLYPECTOMY INTESTINAL  Patient Location: Endoscopy Unit  Anesthesia Type:General  Level of Consciousness: awake  Airway & Oxygen Therapy: Patient Spontanous Breathing  Post-op Assessment: Report given to RN  Post vital signs: Reviewed  Last Vitals:  Vitals Value Taken Time  BP    Temp    Pulse    Resp    SpO2      Last Pain:  Vitals:   10/12/21 1045  TempSrc:   PainSc: 0-No pain      Patients Stated Pain Goal: 8 (28/78/67 6720)  Complications: No notable events documented.

## 2021-10-12 NOTE — Discharge Instructions (Signed)
You are being discharged to home.  Resume your previous diet.  We are waiting for your pathology results.  Your physician has recommended a repeat colonoscopy for surveillance based on pathology results.  

## 2021-10-13 LAB — SURGICAL PATHOLOGY

## 2021-10-14 ENCOUNTER — Encounter (HOSPITAL_COMMUNITY): Payer: Self-pay | Admitting: Gastroenterology

## 2021-10-14 ENCOUNTER — Other Ambulatory Visit: Payer: Self-pay | Admitting: Nurse Practitioner

## 2021-10-14 DIAGNOSIS — N401 Enlarged prostate with lower urinary tract symptoms: Secondary | ICD-10-CM

## 2021-10-21 ENCOUNTER — Encounter (INDEPENDENT_AMBULATORY_CARE_PROVIDER_SITE_OTHER): Payer: Self-pay | Admitting: *Deleted

## 2021-10-25 ENCOUNTER — Telehealth: Payer: Self-pay | Admitting: "Endocrinology

## 2021-10-25 NOTE — Telephone Encounter (Signed)
Patient is concerned with his readings- He does not have a follow up appointment until January. He said he is trying to eat better and exercise more.  10/30- 204 10/31-200 11/1-190

## 2021-10-25 NOTE — Telephone Encounter (Signed)
Pt made aware he will come this Friday with meter and logs

## 2021-10-25 NOTE — Telephone Encounter (Signed)
When do you want him to be seen, he has one schedule for January

## 2021-10-27 NOTE — Telephone Encounter (Signed)
Error

## 2021-10-28 ENCOUNTER — Ambulatory Visit: Payer: Medicare HMO | Admitting: "Endocrinology

## 2021-10-28 ENCOUNTER — Other Ambulatory Visit: Payer: Self-pay

## 2021-10-28 ENCOUNTER — Encounter: Payer: Self-pay | Admitting: "Endocrinology

## 2021-10-28 VITALS — BP 108/84 | HR 64 | Ht 71.0 in | Wt 232.4 lb

## 2021-10-28 DIAGNOSIS — N182 Chronic kidney disease, stage 2 (mild): Secondary | ICD-10-CM

## 2021-10-28 DIAGNOSIS — E1122 Type 2 diabetes mellitus with diabetic chronic kidney disease: Secondary | ICD-10-CM

## 2021-10-28 DIAGNOSIS — E782 Mixed hyperlipidemia: Secondary | ICD-10-CM

## 2021-10-28 LAB — POCT GLYCOSYLATED HEMOGLOBIN (HGB A1C): HbA1c, POC (controlled diabetic range): 6 % (ref 0.0–7.0)

## 2021-10-28 MED ORDER — METFORMIN HCL 500 MG PO TABS: 500.0000 mg | ORAL_TABLET | Freq: Two times a day (BID) | ORAL | 1 refills | Status: DC

## 2021-10-28 NOTE — Progress Notes (Signed)
12/23/2020, 1:52 PM  Endocrinology follow-up note   Subjective:    Patient ID: Andres Robinson, male    DOB: 01-17-1956.  Andres Robinson is being seen in  Follow up after he was seen in consultation for management of currently uncontrolled symptomatic diabetes requested by  Perlie Mayo, NP.   Past Medical History:  Diagnosis Date   Acute bacterial sinusitis 03/26/2020   DDD (degenerative disc disease), cervical    DDD (degenerative disc disease), lumbar    High cholesterol    Normal cardiac stress test 2005   RMSF [facility] spotted fever) 2013   Type 2 diabetes mellitus with stage 2 chronic kidney disease, without long-term current use of insulin ([facility]) 09/16/2019    Past Surgical History:  Procedure Laterality Date   CATARACT EXTRACTION Bilateral    COLONOSCOPY     ESOPHAGOGASTRODUODENOSCOPY N/A 01/14/2014   Procedure: ESOPHAGOGASTRODUODENOSCOPY (EGD);  Surgeon: Rogene Houston, MD;  Location: [facility];  Service: Endoscopy;  Laterality: N/A;  320    Social History   Socioeconomic History   Marital status: Married    Spouse name: Tena    Number of children: 0   Years of education: Not on file   Highest education level: Some college, no degree  Occupational History   Not on file  Tobacco Use   Smoking status: Former Smoker   Smokeless tobacco: Never Used   Tobacco comment: quit in 1982  Substance and Sexual Activity   Alcohol use: Yes    Comment: 2-3 glasses wine a week   Drug use: No   Sexual activity: Yes    Partners: Female  Other Topics Concern   Not on file  Social History Narrative   Lives with Tena married since 1981      Pets: 1 boston terrier -deaf and one eye: Ladell Heads      Enjoys: hunt and being outside      Diet: eat all food groups   Caffeine: 4 cups of coffee   Water: 4 glasses a day      Wears seat belt    Smoke detectors    Does not use phone while driving   Social Determinants of Adult nurse Strain: Low Risk    Difficulty of Paying Living Expenses: Not hard at all  Food Insecurity: No Food Insecurity   Worried About Charity fundraiser in the Last Year: Never true   Arboriculturist in the Last Year: Never true  Transportation Needs: No Transportation Needs   Lack of Transportation (Medical): No   Lack of Transportation (Non-Medical): No  Physical Activity: Sufficiently Active   Days of Exercise per Week: 5 days   Minutes of Exercise per Session: 30 min  Stress: No Stress Concern Present   Feeling of Stress : Only a little  Social Connections: Moderately Integrated   Frequency of Communication with Friends and Family: More than three times a week   Frequency of Social Gatherings with Friends and Family: More than three times a week   Attends Religious Services: 1 to 4 times per year   Active Member of [employer] or Organizations: No   Attends Archivist Meetings: Never   Marital Status: Married    Family History  Problem Relation Age of Onset   Heart disease Father    Diabetes Father    Hyperlipidemia Father    Hypertension Father  AAA (abdominal aortic aneurysm) Father    Peripheral vascular disease Father        amputation   Hypertension Brother    Heart attack Brother     Outpatient Encounter Medications as of 12/23/2020  Medication Sig   melatonin 5 MG TABS Take 5 mg by mouth at bedtime.   metFORMIN (GLUCOPHAGE) 500 MG tablet Take 1 tablet (500 mg total) by mouth daily with breakfast.   ONETOUCH ULTRA test strip CHECK BLOOD SUGAR ONCE ANDAY   simvastatin (ZOCOR) 20 MG tablet Take 1 tablet (20 mg total) by mouth daily at 6 PM.   tamsulosin (FLOMAX) 0.4 MG CAPS capsule Take 1 capsule (0.4 mg total) by mouth 2 (two) times daily.   [DISCONTINUED] metFORMIN (GLUCOPHAGE) 500 MG tablet Take 1 tablet (500 mg total) by mouth 2 (two) times daily with a meal.   No facility-administered encounter medications on file as of 12/23/2020.     ALLERGIES: Allergies  Allergen Reactions   Penicillins Other (See Comments)    Childhood Reaction     VACCINATION STATUS: Immunization History  Administered Date(s) Administered   Fluad Quad(high Dose 65+) 09/29/2020   Tdap 10/07/2018    Diabetes He presents for his follow-up diabetic visit. He has type 2 diabetes mellitus. Onset time: He was diagnosed at approximate age of 48 years. His disease course has been improving. There are no hypoglycemic associated symptoms. Pertinent negatives for hypoglycemia include no confusion, headaches, pallor or seizures. There are no diabetic associated symptoms. Pertinent negatives for diabetes include no chest pain, no fatigue, no polydipsia, no polyphagia, no polyuria and no weakness. There are no hypoglycemic complications. Symptoms are improving. There are no diabetic complications. Risk factors for coronary artery disease include diabetes mellitus, dyslipidemia, male sex, obesity, tobacco exposure, sedentary lifestyle and family history. Current diabetic treatment includes oral agent (monotherapy). His weight is fluctuating minimally. He is following a diabetic diet. He has not had a previous visit with a dietitian. He rarely participates in exercise. His home blood glucose trend is decreasing steadily. His breakfast blood glucose range is generally 180-200 mg/dl. His overall blood glucose range is 180-200 mg/dl. (He presents with controlled glycemic profile and POC a1c of 6%, overall improving from 7.2%.  His meter EAG is 188 for 6 readings in 7 days.    ) An ACE inhibitor/angiotensin II receptor blocker is not being taken. Eye exam is current.  Hyperlipidemia This is a chronic problem. The current episode started more than 1 year ago. The problem is uncontrolled. Exacerbating diseases include diabetes and obesity. Pertinent negatives include no chest pain, myalgias or shortness of breath. Current antihyperlipidemic treatment includes statins. Risk  factors for coronary artery disease include dyslipidemia, diabetes mellitus, obesity, male sex and a sedentary lifestyle.    Review of Systems  Constitutional:  Negative for chills, fatigue, fever and unexpected weight change.  HENT:  Negative for dental problem, mouth sores and trouble swallowing.   Eyes:  Negative for visual disturbance.  Respiratory:  Negative for cough, choking, chest tightness, shortness of breath and wheezing.   Cardiovascular:  Negative for chest pain, palpitations and leg swelling.  Gastrointestinal:  Negative for abdominal distention, abdominal pain, constipation, diarrhea, nausea and vomiting.  Endocrine: Negative for polydipsia, polyphagia and polyuria.  Genitourinary:  Negative for dysuria, flank pain, hematuria and urgency.  Musculoskeletal:  Negative for back pain, gait problem, myalgias and neck pain.  Skin:  Negative for pallor, rash and wound.  Neurological:  Negative for seizures, syncope, weakness,  numbness and headaches.  Psychiatric/Behavioral:  Negative for confusion and dysphoric mood.    Objective:    BP 135/87   Pulse 66   Ht 5\' 11"  (1.803 m)   Wt 226 lb 9.6 oz (102.8 kg)   BMI 31.60 kg/m   Wt Readings from Last 3 Encounters:  12/23/20 226 lb 9.6 oz (102.8 kg)  12/13/20 226 lb (102.5 kg)  09/29/20 218 lb (98.9 kg)     Physical Exam Constitutional:      General: He is not in acute distress.    Appearance: He is well-developed.  HENT:     Head: Normocephalic and atraumatic.  Neck:     Thyroid: No thyromegaly.     Trachea: No tracheal deviation.  Cardiovascular:     Rate and Rhythm: Normal rate.     Pulses:          Dorsalis pedis pulses are 1+ on the right side and 1+ on the left side.       Posterior tibial pulses are 1+ on the right side and 1+ on the left side.     Heart sounds: S1 normal and S2 normal. No murmur heard.   No gallop.  Pulmonary:     Effort: Pulmonary effort is normal. No respiratory distress.     Breath  sounds: No wheezing.  Abdominal:     General: There is no distension.     Tenderness: There is no abdominal tenderness. There is no guarding.  Musculoskeletal:     Right shoulder: No swelling or deformity.     Cervical back: Normal range of motion and neck supple.  Skin:    General: Skin is warm and dry.     Findings: No rash.     Nails: There is no clubbing.  Neurological:     Mental Status: He is alert and oriented to person, place, and time.     Cranial Nerves: No cranial nerve deficit.     Sensory: No sensory deficit.     Gait: Gait normal.     Deep Tendon Reflexes: Reflexes are normal and symmetric.  Psychiatric:        Speech: Speech normal.        Behavior: Behavior normal. Behavior is cooperative.        Thought Content: Thought content normal.        Judgment: Judgment normal.     CMP     Component Value Date/Time   NA 141 12/13/2020 1457   K 4.7 12/13/2020 1457   CL 102 12/13/2020 1457   CO2 23 12/13/2020 1457   GLUCOSE 93 12/13/2020 1457   GLUCOSE 123 (H) 06/16/2020 0731   BUN 18 12/13/2020 1457   CREATININE 1.11 12/13/2020 1457   CREATININE 1.08 06/16/2020 0731   CALCIUM 9.5 12/13/2020 1457   PROT 7.2 12/13/2020 1457   ALBUMIN 4.7 12/13/2020 1457   AST 21 12/13/2020 1457   ALT 21 12/13/2020 1457   ALKPHOS 58 12/13/2020 1457   BILITOT 0.5 12/13/2020 1457   GFRNONAA 69 12/13/2020 1457   GFRNONAA 66 03/10/2020 0943   GFRAA 80 12/13/2020 1457   GFRAA 76 03/10/2020 0943     Diabetic Labs (most recent): Lab Results  Component Value Date   HGBA1C 5.8 (H) 10/05/2020   HGBA1C 5.6 06/22/2020   HGBA1C 5.4 12/23/2019     Lipid Panel ( most recent) Lipid Panel     Component Value Date/Time   CHOL 124 10/05/2020 0815   TRIG 110 10/05/2020  0815   HDL 45 10/05/2020 0815   CHOLHDL 2.8 10/05/2020 0815   CHOLHDL 2.8 03/10/2020 0943   LDLCALC 59 10/05/2020 0815   LDLCALC 55 03/10/2020 0943      Assessment & Plan:   1. Type 2 diabetes mellitus with  stage 2 chronic kidney disease, without long-term current use of insulin ([facility])  - Candy Ziegler Connors has currently controlled asymptomatic type 2 DM since  66 years of age.  He presents with controlled glycemic profile and POC a1c of 6%, overall improving from 7.2%.  His meter EAG is 188 for 6 readings in 7 days.   Recent labs reviewed. - I had a long discussion with him about the progressive nature of diabetes and the pathology behind its complications. -He does has mild microalbuminuria, no gross complications , however, he remains at a high risk for more acute and chronic complications which include CAD, CVA, CKD, retinopathy, and neuropathy. These are all discussed in detail with him.  - I have counseled him on diet  and weight management  by adopting a carbohydrate restricted/protein rich diet. Patient is encouraged to switch to  unprocessed or minimally processed     complex starch and increased protein intake (animal or plant source), fruits, and vegetables. -  he is advised to stick to a routine mealtimes to eat 3 meals  a day and avoid unnecessary snacks ( to snack only to correct hypoglycemia).   - he acknowledges that there is a room for improvement in his food and drink choices. - Suggestion is made for him to avoid simple carbohydrates  from his diet including Cakes, Sweet Desserts, Ice Cream, Soda (diet and regular), Sweet Tea, Candies, Chips, Cookies, Store Bought Juices, Alcohol in Excess of  1-2 drinks a day, Artificial Sweeteners,  Coffee Creamer, and "Sugar-free" Products, Lemonade. This will help patient to have more stable blood glucose profile and potentially avoid unintended weight gain.  - he has been  scheduled with Jearld Fenton, RDN, CDE for diabetes education.  - I have approached him with the following individualized plan to manage  his diabetes and patient agrees:   - Based on his presentation with near  target glycemia  and a1c of 6%, he will not need any more  treatment than metformin.  However, he is advised to increase Metformin to 500 mg po BID. -  He is regaining some of the weight he lost. -He has several options if he loses control of diabetes during his next visit, including glipizide therapy, insulin, incretin therapy as appropriate.  - Specific targets for  A1c;  LDL, HDL, Triglycerides,  were discussed with the patient.  2) Blood Pressure /Hypertension: His blood pressure is controlled to target.   He is not on any antihypertensive medications.  3) Lipids/Hyperlipidemia:   Review of his recent lipid panel showed controlled  LDL 55, improving from 80.  He is advised to continue simvastatin 20 mg p.o. daily at bedtime.    Side effects and precautions discussed with him.  4)  Weight/Diet:  Body mass index is 31.6 kg/m.  -   clearly complicating his diabetes care.   he is  a candidate for weight loss. I discussed with him the fact that loss of 5 - 10% of his  current body weight will have the most impact on his diabetes management.  Exercise, and detailed carbohydrates information provided  -  detailed on discharge instructions.  5) Chronic Care/Health Maintenance:  -he  is on Statin  medications and  is encouraged to initiate and continue to follow up with Ophthalmology, Dentist,  Podiatrist at least yearly or according to recommendations, and advised to   stay away from smoking. I have recommended yearly flu vaccine and pneumonia vaccine at least every 5 years; moderate intensity exercise for up to 150 minutes weekly; and  sleep for at least 7 hours a day.  His screening ABI was negative for PAD in September 2021, his study will be repeated in December 2026, or sooner if needed.   - he is  advised to maintain close follow up with Perlie Mayo, NP for primary care needs, as well as his other providers for optimal and coordinated care.    I spent 34 minutes in the care of the patient today including review of labs from [place], Lipids,  Thyroid Function, Hematology (current and previous including abstractions from other facilities); face-to-face time discussing  his blood glucose readings/logs, discussing hypoglycemia and hyperglycemia episodes and symptoms, medications doses, his options of short and long term treatment based on the latest standards of care / guidelines;  discussion about incorporating lifestyle medicine;  and documenting the encounter.    Please refer to Patient Instructions for Blood Glucose Monitoring and Insulin/Medications Dosing Guide"  in media tab for additional information. Please  also refer to " Patient Self Inventory" in the Media  tab for reviewed elements of pertinent patient history.  [name] participated in the discussions, expressed understanding, and voiced agreement with the above plans.  All questions were answered to his satisfaction. he is encouraged to contact clinic should he have any questions or concerns prior to his return visit.   Follow up plan: - Return in about 1 year (around 12/23/2021) for F/U with Pre-visit Labs, A1c -NV, Urine MA - NV.  Glade Lloyd, MD [facility] [address] [place], [place] [address] Phone: [phone]  Fax: [phone]    12/23/2020, 1:52 PM  This note was partially dictated with voice recognition software. Similar sounding words can be transcribed inadequately or may not  be corrected upon review.

## 2021-10-28 NOTE — Patient Instructions (Signed)

## 2021-11-09 ENCOUNTER — Ambulatory Visit: Payer: Medicare HMO | Admitting: Nurse Practitioner

## 2021-11-10 ENCOUNTER — Other Ambulatory Visit: Payer: Self-pay | Admitting: "Endocrinology

## 2021-12-06 ENCOUNTER — Encounter: Payer: Self-pay | Admitting: Family Medicine

## 2021-12-06 ENCOUNTER — Ambulatory Visit (INDEPENDENT_AMBULATORY_CARE_PROVIDER_SITE_OTHER): Payer: Medicare HMO | Admitting: Family Medicine

## 2021-12-06 VITALS — BP 123/74 | HR 71 | Temp 98.3°F | Ht 71.0 in | Wt 230.2 lb

## 2021-12-06 DIAGNOSIS — N401 Enlarged prostate with lower urinary tract symptoms: Secondary | ICD-10-CM | POA: Diagnosis not present

## 2021-12-06 DIAGNOSIS — R351 Nocturia: Secondary | ICD-10-CM

## 2021-12-06 DIAGNOSIS — E782 Mixed hyperlipidemia: Secondary | ICD-10-CM

## 2021-12-06 NOTE — Progress Notes (Signed)
New Patient Office Visit  Assessment & Plan:  1. Mixed hyperlipidemia Well controlled on current regimen.   2. Benign prostatic hyperplasia with nocturia Well controlled on current regimen.    Follow-up: Return in about 6 months (around 06/06/2022) for annual physical.   Hendricks Limes, MSN, APRN, FNP-C [facility]  Subjective:  Patient ID: Andres Robinson, male    DOB: 1956-12-14  Age: 65 y.o. MRN: [id number]  Patient Care Team: Loman Brooklyn, FNP as PCP - General (Family Medicine)  CC:  Chief Complaint  Patient presents with   New Patient (Initial Visit)    [place] [facility]    Establish Care    HPI Andres Robinson presents to establish care.   Diabetes: Managed by Dr. Dorris Fetch.  Hyperlipidemia: Taking simvastatin and fish oil daily.  BPH: Taking tamsulosin twice daily.   Review of Systems  Constitutional:  Negative for chills, fever, malaise/fatigue and weight loss.  HENT:  Negative for congestion, ear discharge, ear pain, nosebleeds, sinus pain, sore throat and tinnitus.   Eyes:  Negative for blurred vision, double vision, pain, discharge and redness.  Respiratory:  Negative for cough, shortness of breath and wheezing.   Cardiovascular:  Negative for chest pain, palpitations and leg swelling.  Gastrointestinal:  Negative for abdominal pain, constipation, diarrhea, heartburn, nausea and vomiting.  Genitourinary:  Negative for dysuria, frequency and urgency.  Musculoskeletal:  Negative for myalgias.  Skin:  Negative for rash.  Neurological:  Negative for dizziness, seizures, weakness and headaches.  Psychiatric/Behavioral:  Negative for depression, substance abuse and suicidal ideas. The patient is not nervous/anxious.    Current Outpatient Medications:    acetaminophen (TYLENOL) 650 MG CR tablet, Take 1,300 mg by mouth every 8 (eight) hours as needed for pain., Disp: , Rfl:    diphenhydramine-acetaminophen (TYLENOL PM) 25-500 MG TABS  tablet, Take 1 tablet by mouth at bedtime as needed (sleep)., Disp: , Rfl:    GLUCOSAMINE-CHONDROITIN PO, Take 2 tablets by mouth daily as needed., Disp: , Rfl:    metFORMIN (GLUCOPHAGE) 500 MG tablet, Take 1 tablet (500 mg total) by mouth 2 (two) times daily with a meal., Disp: 180 tablet, Rfl: 1   Omega-3 Fatty Acids (FISH OIL ULTRA) 1400 MG CAPS, Take 1,400 mg by mouth daily., Disp: , Rfl:    ONETOUCH ULTRA test strip, CHECK BLOOD SUGAR ONCE ANDAY, Disp: 100 strip, Rfl: 2   oxymetazoline (AFRIN) 0.05 % nasal spray, Place 1 spray into both nostrils 2 (two) times daily as needed for congestion., Disp: , Rfl:    simvastatin (ZOCOR) 20 MG tablet, Take 20 mg by mouth daily at 6 PM., Disp: , Rfl:    tamsulosin (FLOMAX) 0.4 MG CAPS capsule, TAKE ONE CAPSULE BY MOUTH TWICE DAILY, Disp: 180 capsule, Rfl: 0  Allergies  Allergen Reactions   Penicillins Other (See Comments)    Childhood Reaction     Past Medical History:  Diagnosis Date   DDD (degenerative disc disease), cervical    DDD (degenerative disc disease), lumbar    High cholesterol    Normal cardiac stress test 2005   RMSF [facility] spotted fever) 2013   Type 2 diabetes mellitus with stage 2 chronic kidney disease, without long-term current use of insulin ([place]) 09/16/2019    Past Surgical History:  Procedure Laterality Date   CATARACT EXTRACTION Bilateral    COLONOSCOPY     COLONOSCOPY WITH PROPOFOL N/A 10/12/2021   Procedure: COLONOSCOPY WITH PROPOFOL;  Surgeon: Montez Morita,  Quillian Quince, MD;  [facility];  Service: Gastroenterology;  Laterality: N/A;  10:45   ESOPHAGOGASTRODUODENOSCOPY N/A 01/14/2014   Procedure: ESOPHAGOGASTRODUODENOSCOPY (EGD);  Surgeon: Rogene Houston, MD;  [facility];  Service: Endoscopy;  Laterality: N/A;  320   POLYPECTOMY  10/12/2021   Procedure: POLYPECTOMY INTESTINAL;  Surgeon: Montez Morita, Quillian Quince, MD;  [facility];  Service: Gastroenterology;;    VASECTOMY      Family History  Problem Relation Age of Onset   Early death Mother    Heart disease Father    Diabetes Father    Hyperlipidemia Father    Hypertension Father    AAA (abdominal aortic aneurysm) Father    Peripheral vascular disease Father        amputation   Heart Problems Father    Hypertension Brother    Heart attack Brother     Social History   Socioeconomic History   Marital status: Married    Spouse name: Tena    Number of children: 0   Years of education: Not on file   Highest education level: Some college, no degree  Occupational History   Not on file  Tobacco Use   Smoking status: Former    Types: Cigarettes    Quit date: 1982    Years since quitting: 40.9   Smokeless tobacco: Never   Tobacco comments:    quit in 1982  Vaping Use   Vaping Use: Never used  Substance and Sexual Activity   Alcohol use: Not Currently    Alcohol/week: 7.0 standard drinks    Types: 7 Standard drinks or equivalent per week   Drug use: No   Sexual activity: Not Currently    Partners: Female  Other Topics Concern   Not on file  Social History Narrative   Lives with Tena married since 1981      Pets: 1 boston terrier -deaf and one eye: Ladell Heads      Enjoys: hunt and being outside      Diet: eat all food groups   Caffeine: 4 cups of coffee   Water: 4 glasses a day      Wears seat belt    Smoke detectors    Does not use phone while driving   Social Determinants of Radio broadcast assistant Strain: Low Risk    Difficulty of Paying Living Expenses: Not hard at all  Food Insecurity: No Food Insecurity   Worried About Charity fundraiser in the Last Year: Never true   Arboriculturist in the Last Year: Never true  Transportation Needs: No Transportation Needs   Lack of Transportation (Medical): No   Lack of Transportation (Non-Medical): No  Physical Activity: Sufficiently Active   Days of Exercise per Week: 7 days   Minutes of Exercise per Session: 30  min  Stress: No Stress Concern Present   Feeling of Stress : Not at all  Social Connections: Moderately Integrated   Frequency of Communication with Friends and Family: Twice a week   Frequency of Social Gatherings with Friends and Family: Three times a week   Attends Religious Services: 1 to 4 times per year   Active Member of Clubs or Organizations: No   Attends Archivist Meetings: Never   Marital Status: Married  Human resources officer Violence: Not At Risk   Fear of Current or Ex-Partner: No   Emotionally Abused: No   Physically Abused: No   Sexually Abused: No  Objective:   Today's Vitals: BP 123/74    Pulse 71    Temp 98.3 F (36.8 C) (Temporal)    Ht 5\' 11"  (1.803 m)    Wt 230 lb 3.2 oz (104.4 kg)    SpO2 97%    BMI 32.11 kg/m   Physical Exam Vitals reviewed.  Constitutional:      General: He is not in acute distress.    Appearance: Normal appearance. He is obese. He is not ill-appearing, toxic-appearing or diaphoretic.  HENT:     Head: Normocephalic and atraumatic.  Eyes:     General: No scleral icterus.       Right eye: No discharge.        Left eye: No discharge.     Conjunctiva/sclera: Conjunctivae normal.  Cardiovascular:     Rate and Rhythm: Normal rate and regular rhythm.     Heart sounds: Normal heart sounds. No murmur heard.   No friction rub. No gallop.  Pulmonary:     Effort: Pulmonary effort is normal. No respiratory distress.     Breath sounds: Normal breath sounds. No stridor. No wheezing, rhonchi or rales.  Musculoskeletal:        General: Normal range of motion.     Cervical back: Normal range of motion.  Skin:    General: Skin is warm and dry.  Neurological:     Mental Status: He is alert and oriented to person, place, and time. Mental status is at baseline.  Psychiatric:        Mood and Affect: Mood normal.        Behavior: Behavior normal.        Thought Content: Thought content normal.        Judgment: Judgment normal.

## 2021-12-27 ENCOUNTER — Ambulatory Visit: Payer: Medicare HMO | Admitting: "Endocrinology

## 2022-01-11 ENCOUNTER — Encounter: Payer: Medicare HMO | Admitting: Nurse Practitioner

## 2022-01-24 ENCOUNTER — Other Ambulatory Visit: Payer: Self-pay | Admitting: Family Medicine

## 2022-01-24 DIAGNOSIS — N401 Enlarged prostate with lower urinary tract symptoms: Secondary | ICD-10-CM

## 2022-02-02 ENCOUNTER — Encounter: Payer: Self-pay | Admitting: *Deleted

## 2022-02-07 ENCOUNTER — Encounter: Payer: Medicare HMO | Admitting: Nurse Practitioner

## 2022-02-28 ENCOUNTER — Ambulatory Visit: Payer: Medicare HMO | Admitting: "Endocrinology

## 2022-02-28 ENCOUNTER — Other Ambulatory Visit: Payer: Self-pay

## 2022-02-28 ENCOUNTER — Encounter: Payer: Self-pay | Admitting: "Endocrinology

## 2022-02-28 VITALS — BP 136/90 | HR 64 | Ht 71.0 in | Wt 232.2 lb

## 2022-02-28 DIAGNOSIS — E6609 Other obesity due to excess calories: Secondary | ICD-10-CM

## 2022-02-28 DIAGNOSIS — E1122 Type 2 diabetes mellitus with diabetic chronic kidney disease: Secondary | ICD-10-CM

## 2022-02-28 DIAGNOSIS — E782 Mixed hyperlipidemia: Secondary | ICD-10-CM

## 2022-02-28 DIAGNOSIS — Z6832 Body mass index (BMI) 32.0-32.9, adult: Secondary | ICD-10-CM

## 2022-02-28 DIAGNOSIS — N182 Chronic kidney disease, stage 2 (mild): Secondary | ICD-10-CM | POA: Diagnosis not present

## 2022-02-28 LAB — POCT GLYCOSYLATED HEMOGLOBIN (HGB A1C): HbA1c, POC (controlled diabetic range): 5.9 % (ref 0.0–7.0)

## 2022-02-28 MED ORDER — METFORMIN HCL 500 MG PO TABS: 500.0000 mg | ORAL_TABLET | Freq: Every day | ORAL | 1 refills | Status: DC

## 2022-02-28 NOTE — Patient Instructions (Signed)

## 2022-02-28 NOTE — Progress Notes (Signed)
02/28/2022, 4:48 PM  Endocrinology follow-up note   Subjective:    Patient ID: Andres Robinson, male    DOB: [birth year].  Andres Robinson is being seen in  Follow up after he was seen in consultation for management of currently uncontrolled symptomatic diabetes requested by  Loman Brooklyn, FNP.   Past Medical History:  Diagnosis Date   DDD (degenerative disc disease), cervical    DDD (degenerative disc disease), lumbar    High cholesterol    Normal cardiac stress test 2005   RMSF [facility] spotted fever) 2013   Type 2 diabetes mellitus with stage 2 chronic kidney disease, without long-term current use of insulin ([place]) 09/16/2019    Past Surgical History:  Procedure Laterality Date   CATARACT EXTRACTION Bilateral    COLONOSCOPY     COLONOSCOPY WITH PROPOFOL N/A 10/12/2021   Procedure: COLONOSCOPY WITH PROPOFOL;  Surgeon: Harvel Quale, MD;  [facility];  Service: Gastroenterology;  Laterality: N/A;  10:45   ESOPHAGOGASTRODUODENOSCOPY N/A 01/14/2014   Procedure: ESOPHAGOGASTRODUODENOSCOPY (EGD);  Surgeon: Rogene Houston, MD;  [facility];  Service: Endoscopy;  Laterality: N/A;  320   POLYPECTOMY  10/12/2021   Procedure: POLYPECTOMY INTESTINAL;  Surgeon: Montez Morita, Quillian Quince, MD;  [facility];  Service: Gastroenterology;;   VASECTOMY      Social History   Socioeconomic History   Marital status: Married    Spouse name: Tena    Number of children: 0   Years of education: Not on file   Highest education level: Some college, no degree  Occupational History   Not on file  Tobacco Use   Smoking status: Former    Types: Cigarettes    Quit date: 1982    Years since quitting: 41.2   Smokeless tobacco: Never   Tobacco comments:    quit in 1982  Vaping Use   Vaping Use: Never used  Substance and Sexual Activity   Alcohol use: Not Currently    Alcohol/week: 7.0 standard drinks    Types: 7  Standard drinks or equivalent per week   Drug use: No   Sexual activity: Not Currently    Partners: Female  Other Topics Concern   Not on file  Social History Narrative   Lives with Tena married since 1981      Pets: 1 boston terrier -deaf and one eye: Ladell Heads      Enjoys: hunt and being outside      Diet: eat all food groups   Caffeine: 4 cups of coffee   Water: 4 glasses a day      Wears seat belt    Smoke detectors    Does not use phone while driving   Social Determinants of Radio broadcast assistant Strain: Low Risk    Difficulty of Paying Living Expenses: Not hard at all  Food Insecurity: No Food Insecurity   Worried About Charity fundraiser in the Last Year: Never true   Arboriculturist in the Last Year: Never true  Transportation Needs: No Transportation Needs   Lack of Transportation (Medical): No   Lack of Transportation (Non-Medical): No  Physical Activity: Sufficiently Active   Days of Exercise per Week: 7 days   Minutes of Exercise per Session: 30 min  Stress: No Stress Concern Present   Feeling of Stress : Not at all  Social Connections: Moderately Integrated   Frequency  of Communication with Friends and Family: Twice a week   Frequency of Social Gatherings with Friends and Family: Three times a week   Attends Religious Services: 1 to 4 times per year   Active Member of Clubs or Organizations: No   Attends Archivist Meetings: Never   Marital Status: Married    Family History  Problem Relation Age of Onset   Early death Mother    Heart disease Father    Diabetes Father    Hyperlipidemia Father    Hypertension Father    AAA (abdominal aortic aneurysm) Father    Peripheral vascular disease Father        amputation   Heart Problems Father    Hypertension Brother    Heart attack Brother     Outpatient Encounter Medications as of 02/28/2022  Medication Sig   acetaminophen (TYLENOL) 650 MG CR tablet Take 1,300 mg by mouth every 8  (eight) hours as needed for pain.   diphenhydramine-acetaminophen (TYLENOL PM) 25-500 MG TABS tablet Take 1 tablet by mouth at bedtime as needed (sleep).   GLUCOSAMINE-CHONDROITIN PO Take 2 tablets by mouth daily as needed.   metFORMIN (GLUCOPHAGE) 500 MG tablet Take 1 tablet (500 mg total) by mouth daily with breakfast.   Omega-3 Fatty Acids (FISH OIL ULTRA) 1400 MG CAPS Take 1,400 mg by mouth daily.   ONETOUCH ULTRA test strip CHECK BLOOD SUGAR ONCE ANDAY   oxymetazoline (AFRIN) 0.05 % nasal spray Place 1 spray into both nostrils 2 (two) times daily as needed for congestion.   simvastatin (ZOCOR) 20 MG tablet Take 20 mg by mouth daily at 6 PM.   tamsulosin (FLOMAX) 0.4 MG CAPS capsule TAKE ONE CAPSULE BY MOUTH TWICE DAILY   [DISCONTINUED] metFORMIN (GLUCOPHAGE) 500 MG tablet Take 1 tablet (500 mg total) by mouth 2 (two) times daily with a meal.   No facility-administered encounter medications on file as of 02/28/2022.    ALLERGIES: Allergies  Allergen Reactions   Penicillins Other (See Comments)    Childhood Reaction     VACCINATION STATUS: Immunization History  Administered Date(s) Administered   Fluad Quad(high Dose 65+) 09/29/2020   Moderna Sars-Covid-2 Vaccination 02/19/2020, 03/19/2020, 01/18/2021   Tdap 10/07/2018    Diabetes He presents for his follow-up diabetic visit. He has type 2 diabetes mellitus. Onset time: He was diagnosed at approximate age of 19 years. His disease course has been improving. There are no hypoglycemic associated symptoms. Pertinent negatives for hypoglycemia include no confusion, headaches, pallor or seizures. There are no diabetic associated symptoms. Pertinent negatives for diabetes include no chest pain, no fatigue, no polydipsia, no polyphagia, no polyuria and no weakness. There are no hypoglycemic complications. Symptoms are improving. There are no diabetic complications. Risk factors for coronary artery disease include diabetes mellitus,  dyslipidemia, male sex, obesity, tobacco exposure, sedentary lifestyle and family history. Current diabetic treatment includes oral agent (monotherapy). His weight is fluctuating minimally. He is following a diabetic diet. He has not had a previous visit with a dietitian. He rarely participates in exercise. His home blood glucose trend is decreasing steadily. His breakfast blood glucose range is generally 110-130 mg/dl. His overall blood glucose range is 110-130 mg/dl. (Mr. Lipkin presents with controlled glycemic profile.  His point-of-care A1c is 5.9%, progressively improving.  He did not document any hypoglycemia.   ) An ACE inhibitor/angiotensin II receptor blocker is not being taken. Eye exam is current.  Hyperlipidemia This is a chronic problem. The current episode started more  than 1 year ago. The problem is uncontrolled. Exacerbating diseases include diabetes and obesity. Pertinent negatives include no chest pain, myalgias or shortness of breath. Current antihyperlipidemic treatment includes statins. Risk factors for coronary artery disease include dyslipidemia, diabetes mellitus, obesity, male sex and a sedentary lifestyle.    Review of Systems  Constitutional:  Negative for chills, fatigue, fever and unexpected weight change.  HENT:  Negative for dental problem, mouth sores and trouble swallowing.   Eyes:  Negative for visual disturbance.  Respiratory:  Negative for cough, choking, chest tightness, shortness of breath and wheezing.   Cardiovascular:  Negative for chest pain, palpitations and leg swelling.  Gastrointestinal:  Negative for abdominal distention, abdominal pain, constipation, diarrhea, nausea and vomiting.  Endocrine: Negative for polydipsia, polyphagia and polyuria.  Genitourinary:  Negative for dysuria, flank pain, hematuria and urgency.  Musculoskeletal:  Negative for back pain, gait problem, myalgias and neck pain.  Skin:  Negative for pallor, rash and wound.   Neurological:  Negative for seizures, syncope, weakness, numbness and headaches.  Psychiatric/Behavioral:  Negative for confusion and dysphoric mood.    Objective:    BP 136/90    Pulse 64    Ht '5\' 11"'$  (1.803 m)    Wt 232 lb 3.2 oz (105.3 kg)    BMI 32.39 kg/m   Wt Readings from Last 3 Encounters:  02/28/22 232 lb 3.2 oz (105.3 kg)  12/06/21 230 lb 3.2 oz (104.4 kg)  10/28/21 232 lb 6.4 oz (105.4 kg)        CMP     Component Value Date/Time   NA 141 07/14/2021 0812   K 4.8 07/14/2021 0812   CL 104 07/14/2021 0812   CO2 23 07/14/2021 0812   GLUCOSE 139 (H) 07/14/2021 0812   GLUCOSE 123 (H) 06/16/2020 0731   BUN 14 07/14/2021 0812   CREATININE 1.17 07/14/2021 0812   CREATININE 1.08 06/16/2020 0731   CALCIUM 8.9 07/14/2021 0812   PROT 6.8 07/14/2021 0812   ALBUMIN 4.1 07/14/2021 0812   AST 17 07/14/2021 0812   ALT 16 07/14/2021 0812   ALKPHOS 56 07/14/2021 0812   BILITOT 0.5 07/14/2021 0812   GFRNONAA 69 12/13/2020 1457   GFRNONAA 66 03/10/2020 0943   GFRAA 80 12/13/2020 1457   GFRAA 76 03/10/2020 0943     Diabetic Labs (most recent): Lab Results  Component Value Date   HGBA1C 5.9 02/28/2022   HGBA1C 6.0 10/28/2021   HGBA1C 6.3 (H) 07/14/2021     Lipid Panel ( most recent) Lipid Panel     Component Value Date/Time   CHOL 198 07/14/2021 0812   TRIG 268 (H) 07/14/2021 0812   HDL 39 (L) 07/14/2021 0812   CHOLHDL 5.1 (H) 07/14/2021 0812   CHOLHDL 2.8 03/10/2020 0943   LDLCALC 112 (H) 07/14/2021 0812   LDLCALC 55 03/10/2020 0943      Assessment & Plan:   1. Type 2 diabetes mellitus with stage 2 chronic kidney disease, without long-term current use of insulin ([facility])  - Alante Tolan Biernat has currently controlled asymptomatic type 2 DM since  66 years of age.  Mr. Busby presents with controlled glycemic profile.  His point-of-care A1c is 5.9%, progressively improving.  He did not document any hypoglycemia.     Recent labs reviewed. - I had a long  discussion with him about the progressive nature of diabetes and the pathology behind its complications. -He does has mild microalbuminuria, no gross complications , however, he remains at a high  risk for more acute and chronic complications which include CAD, CVA, CKD, retinopathy, and neuropathy. These are all discussed in detail with him.  - I have counseled him on diet  and weight management  by adopting a carbohydrate restricted/protein rich diet. Patient is encouraged to switch to  unprocessed or minimally processed     complex starch and increased protein intake (animal or plant source), fruits, and vegetables. -  he is advised to stick to a routine mealtimes to eat 3 meals  a day and avoid unnecessary snacks ( to snack only to correct hypoglycemia).   - he acknowledges that there is a room for improvement in his food and drink choices. - Suggestion is made for him to avoid simple carbohydrates  from his diet including Cakes, Sweet Desserts, Ice Cream, Soda (diet and regular), Sweet Tea, Candies, Chips, Cookies, Store Bought Juices, Alcohol , Artificial Sweeteners,  Coffee Creamer, and "Sugar-free" Products, Lemonade. This will help patient to have more stable blood glucose profile and potentially avoid unintended weight gain.  The following Lifestyle Medicine recommendations according to [facility]) were discussed and and offered to patient and he  agrees to start the journey:  A. Whole Foods, Plant-Based Nutrition comprising of fruits and vegetables, plant-based proteins, whole-grain carbohydrates was discussed in detail with the patient.   A list for source of those nutrients were also provided to the patient.  Patient will use only water or unsweetened tea for hydration. B.  The need to stay away from risky substances including alcohol, smoking; obtaining 7 to 9 hours of restorative sleep, at least 150 minutes of moderate intensity exercise weekly, the  importance of healthy social connections,  and stress management techniques were discussed. C.  A full color page of  Calorie density of various food groups per pound showing examples of each food groups was provided to the patient.    - he has been  scheduled with Jearld Fenton, RDN, CDE for diabetes education.  - I have approached him with the following individualized plan to manage  his diabetes and patient agrees:   - Based on his presentation with near  target glycemia  and a1c of 5.9%, he will not need insulin treatment for now.  He is advised to lower metformin to 500 mg p.o. daily at breakfast.    -He has several options if he loses control of diabetes during his next visit, including glipizide therapy, insulin, incretin therapy as appropriate.  - Specific targets for  A1c;  LDL, HDL, Triglycerides,  were discussed with the patient.  2) Blood Pressure /Hypertension: His blood pressure is controlled to target.   He is not on any antihypertensive medications.  3) Lipids/Hyperlipidemia:   Review of his recent lipid panel showed uncontrolled LDL at 112, increasing from 55.  He is advised to continue simvastatin 20 mg p.o. daily at bedtime.  The above detailed WF PB diet will help with dyslipidemia.   4)  Weight/Diet:  Body mass index is 32.39 kg/m.  -   clearly complicating his diabetes care.   he is  a candidate for weight loss. I discussed with him the fact that loss of 5 - 10% of his  current body weight will have the most impact on his diabetes management.  Exercise, and detailed carbohydrates information provided  -  detailed on discharge instructions.  5) Chronic Care/Health Maintenance:  -he  is on Statin medications and  is encouraged to initiate and  continue to follow up with Ophthalmology, Dentist,  Podiatrist at least yearly or according to recommendations, and advised to   stay away from smoking. I have recommended yearly flu vaccine and pneumonia vaccine at least every 5  years; moderate intensity exercise for up to 150 minutes weekly; and  sleep for at least 7 hours a day.  His screening ABI was negative for PAD in September 2021, his study will be repeated in December 2026, or sooner if needed.   - he is  advised to maintain close follow up with Loman Brooklyn, FNP for primary care needs, as well as his other providers for optimal and coordinated care.   I spent 41 minutes in the care of the patient today including review of labs from [facility], Lipids, Thyroid Function, Hematology (current and previous including abstractions from other facilities); face-to-face time discussing  his blood glucose readings/logs, discussing hypoglycemia and hyperglycemia episodes and symptoms, medications doses, his options of short and long term treatment based on the latest standards of care / guidelines;  discussion about incorporating lifestyle medicine;  and documenting the encounter.    Please refer to Patient Instructions for Blood Glucose Monitoring and Insulin/Medications Dosing Guide"  in media tab for additional information. Please  also refer to " Patient Self Inventory" in the Media  tab for reviewed elements of pertinent patient history.  [name] participated in the discussions, expressed understanding, and voiced agreement with the above plans.  All questions were answered to his satisfaction. he is encouraged to contact clinic should he have any questions or concerns prior to his return visit.    Follow up plan: - Return in about 6 months (around 08/31/2022) for F/U with Pre-visit Labs, Meter, Logs, A1c here.Glade Lloyd, MD [facility] [address] [place], [place] [address] Phone: [phone]  Fax: [phone]    02/28/2022, 4:48 PM  This note was partially dictated with voice recognition software. Similar sounding words can be transcribed inadequately or may not  be corrected upon review.

## 2022-04-26 ENCOUNTER — Other Ambulatory Visit: Payer: Self-pay | Admitting: Family Medicine

## 2022-04-26 ENCOUNTER — Other Ambulatory Visit: Payer: Self-pay | Admitting: "Endocrinology

## 2022-04-26 DIAGNOSIS — N401 Enlarged prostate with lower urinary tract symptoms: Secondary | ICD-10-CM

## 2022-05-08 DIAGNOSIS — Z0000 Encounter for general adult medical examination without abnormal findings: Secondary | ICD-10-CM

## 2022-05-08 NOTE — Progress Notes (Signed)
This encounter was created in error - please disregard.

## 2022-05-08 NOTE — Progress Notes (Deleted)
? ?Subjective:  ? Andres Robinson is a 66 y.o. male who presents for Medicare Annual/Subsequent preventive examination. ? ?Review of Systems    ?I connected with  Andres Robinson on 05/08/22 by a audio enabled telemedicine application and verified that I am speaking with the correct person using two identifiers. ? ?Patient Location: Home ? ?Provider Location: Office/Clinic ? ?I discussed the limitations of evaluation and management by telemedicine. The patient expressed understanding and agreed to proceed.  ?  ? ?   ?Objective:  ?  ?There were no vitals filed for this visit. ?There is no height or weight on file to calculate BMI. ? ? ?  10/12/2021  ?  9:59 AM 05/02/2021  ?  3:51 PM 01/14/2014  ? 11:34 AM  ?Advanced Directives  ?Does Patient Have a Medical Advance Directive? Yes Yes Patient does not have advance directive;Patient would not like information  ?Type of Paramedic of [place];Living will [place];Living will   ?Does patient want to make changes to medical advance directive?  No - Patient declined   ?Copy of [place] in Chart?  No - copy requested   ? ? ?Current Medications (verified) ?Outpatient Encounter Medications as of 05/08/2022  ?Medication Sig  ? acetaminophen (TYLENOL) 650 MG CR tablet Take 1,300 mg by mouth every 8 (eight) hours as needed for pain.  ? diphenhydramine-acetaminophen (TYLENOL PM) 25-500 MG TABS tablet Take 1 tablet by mouth at bedtime as needed (sleep).  ? GLUCOSAMINE-CHONDROITIN PO Take 2 tablets by mouth daily as needed.  ? metFORMIN (GLUCOPHAGE) 500 MG tablet Take 1 tablet (500 mg total) by mouth daily with breakfast.  ? Omega-3 Fatty Acids (FISH OIL ULTRA) 1400 MG CAPS Take 1,400 mg by mouth daily.  ? ONETOUCH ULTRA test strip CHECK BLOOD SUGAR ONCE ANDAY  ? oxymetazoline (AFRIN) 0.05 % nasal spray Place 1 spray into both nostrils 2 (two) times daily as needed for congestion.  ? simvastatin (ZOCOR) 20 MG tablet Take 20  mg by mouth daily at 6 PM.  ? tamsulosin (FLOMAX) 0.4 MG CAPS capsule Take 1 capsule (0.4 mg total) by mouth 2 (two) times daily.  ? ?No facility-administered encounter medications on file as of 05/08/2022.  ? ? ?Allergies (verified) ?Penicillins  ? ?History: ?Past Medical History:  ?Diagnosis Date  ? DDD (degenerative disc disease), cervical   ? DDD (degenerative disc disease), lumbar   ? High cholesterol   ? Normal cardiac stress test 2005  ? RMSF [facility] spotted fever) 2013  ? Type 2 diabetes mellitus with stage 2 chronic kidney disease, without long-term current use of insulin ([place]) 09/16/2019  ? ?Past Surgical History:  ?Procedure Laterality Date  ? CATARACT EXTRACTION Bilateral   ? COLONOSCOPY    ? COLONOSCOPY WITH PROPOFOL N/A 10/12/2021  ? Procedure: COLONOSCOPY WITH PROPOFOL;  Surgeon: Harvel Quale, MD;  [facility];  Service: Gastroenterology;  Laterality: N/A;  10:45  ? ESOPHAGOGASTRODUODENOSCOPY N/A 01/14/2014  ? Procedure: ESOPHAGOGASTRODUODENOSCOPY (EGD);  Surgeon: Rogene Houston, MD;  [facility];  Service: Endoscopy;  Laterality: N/A;  320  ? POLYPECTOMY  10/12/2021  ? Procedure: POLYPECTOMY INTESTINAL;  Surgeon: Harvel Quale, MD;  [facility];  Service: Gastroenterology;;  ? VASECTOMY    ? ?Family History  ?Problem Relation Age of Onset  ? Early death Mother   ? Heart disease Father   ? Diabetes Father   ? Hyperlipidemia Father   ?  Hypertension Father   ? AAA (abdominal aortic aneurysm) Father   ? Peripheral vascular disease Father   ?     amputation  ? Heart Problems Father   ? Hypertension Brother   ? Heart attack Brother   ? ?Social History  ? ?Socioeconomic History  ? Marital status: Married  ?  Spouse name: Carolynn Serve   ? Number of children: 0  ? Years of education: Not on file  ? Highest education level: Some college, no degree  ?Occupational History  ? Not on file  ?Tobacco Use  ? Smoking status: Former  ?  Types: Cigarettes  ?   Quit date: 110  ?  Years since quitting: 41.3  ? Smokeless tobacco: Never  ? Tobacco comments:  ?  quit in 1982  ?Vaping Use  ? Vaping Use: Never used  ?Substance and Sexual Activity  ? Alcohol use: Not Currently  ?  Alcohol/week: 7.0 standard drinks  ?  Types: 7 Standard drinks or equivalent per week  ? Drug use: No  ? Sexual activity: Not Currently  ?  Partners: Female  ?Other Topics Concern  ? Not on file  ?Social History Narrative  ? Lives with [place] married since 1981  ?   ? Pets: 1 boston terrier -deaf and one eye: Ladell Heads  ?   ? Enjoys: hunt and being outside  ?   ? Diet: eat all food groups  ? Caffeine: 4 cups of coffee  ? Water: 4 glasses a day  ?   ? Wears seat belt   ? Smoke detectors   ? Does not use phone while driving  ? ?Social Determinants of Health  ? ?Financial Resource Strain: Not on file  ?Food Insecurity: Not on file  ?Transportation Needs: Not on file  ?Physical Activity: Not on file  ?Stress: Not on file  ?Social Connections: Not on file  ? ? ?Tobacco Counseling ?Counseling given: Not Answered ?Tobacco comments: quit in 1982 ? ? ?Clinical Intake: ? ?Andres Robinson , ?Thank you for taking time to come for your Medicare Wellness Visit. I appreciate your ongoing commitment to your health goals. Please review the following plan we discussed and let me know if I can assist you in the future.  ? ?These are the goals we discussed: ? Goals   ? ?  Patient Stated   ?  I would like to maintain my current health ?  ? ?  ?  ?This is a list of the screening recommended for you and due dates:  ?Health Maintenance  ?Topic Date Due  ? Zoster (Shingles) Vaccine (1 of 2) Never done  ? COVID-19 Vaccine (4 - Booster for Moderna series) 03/15/2021  ? Eye exam for diabetics  10/19/2021  ? Pneumonia Vaccine (1 - PCV) 12/06/2022*  ? Urine Protein Check  07/14/2022  ? Flu Shot  07/25/2022  ? Hemoglobin A1C  08/31/2022  ? Colon Cancer Screening  10/12/2024  ? Tetanus Vaccine  10/07/2028  ? Hepatitis C Screening: USPSTF  Recommendation to screen - Ages 28-79 yo.  Completed  ? HPV Vaccine  Aged Out  ? Complete foot exam   Discontinued  ?*Topic was postponed. The date shown is not the original due date.  ?  ?  ? ?  ? ?  ? ?  ? ?  ? ?Diabetic?yes ?Nutrition Risk Assessment: ? ?Has the patient had any N/V/D within the last 2 months?  {YES/NO:21197} ?Does the patient have any non-healing wounds?  {YES/NO:21197} ?  Has the patient had any unintentional weight loss or weight gain?  {YES/NO:21197} ? ?Diabetes: ? ?Is the patient diabetic?  Yes  ?If diabetic, was a CBG obtained today?  No  ?Did the patient bring in their glucometer from home?  No  ?How often do you monitor your CBG's? ***.  ? ?Financial Strains and Diabetes Management: ? ?Are you having any financial strains with the device, your supplies or your medication? No .  ?Does the patient want to be seen by Chronic Care Management for management of their diabetes?  No  ?Would the patient like to be referred to a Nutritionist or for Diabetic Management?  No  ? ?Diabetic Exams: ? ?Diabetic Eye Exam: Overdue for diabetic eye exam. Pt has been advised about the importance in completing this exam. Patient advised to call and schedule an eye exam. ?Diabetic Foot Exam: Completed 09/29/20   ? ?  ? ?  ? ? ?Activities of Daily Living ?   ? View : No data to display.  ?  ?  ?  ? ? ? ?Patient Care Team: ?Renee Rival, FNP as PCP - General (Nurse Practitioner) ?Cassandria Anger, MD as Consulting Physician (Endocrinology) ? ?Indicate any recent Medical Services you may have received from other than [facility] providers in the past year (date may be approximate). ? ?   ?Assessment:  ? This is a routine wellness examination for Andres Robinson. ? ?Hearing/Vision screen ?No results found. ? ?Dietary issues and exercise activities discussed: ?  ? ? Goals Addressed   ?None ?  ?Depression Screen ? ?  12/06/2021  ?  1:23 PM 08/09/2021  ?  1:49 PM 06/29/2021  ?  1:56 PM 05/02/2021  ?  3:53 PM 09/29/2020  ?  2:42 PM  12/23/2019  ?  1:44 PM 10/27/2019  ? 10:29 AM  ?PHQ 2/9 Scores  ?PHQ - 2 Score 0 0 0 0 0 0 0  ?PHQ- 9 Score 0        ?  ?Fall Risk ? ?  12/06/2021  ?  1:23 PM 08/09/2021  ?  1:49 PM 06/29/2021  ?  1:56 PM 5

## 2022-06-06 ENCOUNTER — Encounter: Payer: Medicare HMO | Admitting: Family Medicine

## 2022-06-08 ENCOUNTER — Encounter: Payer: Medicare HMO | Admitting: Family Medicine

## 2022-06-15 LAB — HM DIABETES EYE EXAM

## 2022-08-02 ENCOUNTER — Ambulatory Visit (INDEPENDENT_AMBULATORY_CARE_PROVIDER_SITE_OTHER): Payer: Medicare HMO | Admitting: Family Medicine

## 2022-08-02 ENCOUNTER — Encounter: Payer: Self-pay | Admitting: Family Medicine

## 2022-08-02 VITALS — BP 137/89 | HR 78 | Temp 97.6°F | Ht 71.0 in | Wt 231.8 lb

## 2022-08-02 DIAGNOSIS — E782 Mixed hyperlipidemia: Secondary | ICD-10-CM

## 2022-08-02 DIAGNOSIS — Z0001 Encounter for general adult medical examination with abnormal findings: Secondary | ICD-10-CM

## 2022-08-02 DIAGNOSIS — N182 Chronic kidney disease, stage 2 (mild): Secondary | ICD-10-CM

## 2022-08-02 DIAGNOSIS — E1122 Type 2 diabetes mellitus with diabetic chronic kidney disease: Secondary | ICD-10-CM

## 2022-08-02 DIAGNOSIS — N401 Enlarged prostate with lower urinary tract symptoms: Secondary | ICD-10-CM

## 2022-08-02 DIAGNOSIS — R351 Nocturia: Secondary | ICD-10-CM

## 2022-08-02 DIAGNOSIS — Z0000 Encounter for general adult medical examination without abnormal findings: Secondary | ICD-10-CM

## 2022-08-02 NOTE — Progress Notes (Unsigned)
Assessment & Plan:  Well adult exam Discussed health benefits of physical activity, and encouraged him to engage in regular exercise appropriate for his age and condition. Preventive health education provided. Declined COVID booster, Shingrix, and Prevnar 20.  Immunization History  Administered Date(s) Administered   Fluad Quad(high Dose 65+) 09/29/2020   Moderna Sars-Covid-2 Vaccination 02/19/2020, 03/19/2020, 01/18/2021   Tdap 10/07/2018   Health Maintenance  Topic Date Due   URINE MICROALBUMIN  07/14/2022   COVID-19 Vaccine (4 - Moderna series) 08/18/2022 (Originally 03/15/2021)   Zoster Vaccines- Shingrix (1 of 2) 11/02/2022 (Originally 02/12/2004)   Pneumonia Vaccine 25+ Years old (1 - PCV) 12/06/2022 (Originally 02/11/2019)   INFLUENZA VACCINE  03/25/2023 (Originally 07/25/2022)   HEMOGLOBIN A1C  08/31/2022   OPHTHALMOLOGY EXAM  06/16/2023   COLONOSCOPY (Pts 45-10yr Insurance coverage will need to be confirmed)  10/12/2024   TETANUS/TDAP  10/07/2028   Hepatitis C Screening  Completed   HPV VACCINES  Aged Out   FOOT EXAM  Discontinued   - CBC with Differential/Platelet - CMP14+EGFR - Lipid panel - PSA, total and free  2. Type 2 diabetes mellitus with stage 2 chronic kidney disease, without long-term current use of insulin ([place] Managed by endocrinology. - simvastatin (ZOCOR) 40 MG tablet; Take 40 mg by mouth at bedtime. - Microalbumin / creatinine urine ratio  3. Mixed hyperlipidemia - simvastatin (ZOCOR) 40 MG tablet; Take 40 mg by mouth at bedtime. - CBC with Differential/Platelet - CMP14+EGFR - Lipid panel  4. Benign prostatic hyperplasia with nocturia Well controlled on current regimen.  - CMP14+EGFR   Follow-up: Return in about 1 year (around 08/03/2023) for annual physical.   BHendricks Limes MSN, APRN, FNP-C [facility]  Subjective:  Patient ID: Andres Robinson male    DOB: 205-Oct-1957 Age: 66y.o. MRN: [id number] Patient Care  Team: NCassandria Anger MD as Consulting Physician (Endocrinology)   CC:  Chief Complaint  Patient presents with   Annual Exam    HPI BSAUNDERS ARLINGTONis a 66y.o. male who presents today for a complete physical exam. He reports consuming a general diet, he does not eat sweets. 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 He generally feels well. He reports sleeping well. He does not have additional problems to discuss today.   Vision:Within last year Dental:Receives regular dental care PWOE:[id number] screening and PSA options (with potential risks and benefits of testing vs. not testing) were discussed along with recent recs/guidelines.  and  Lab Results  Component Value Date   PSA1 0.7 10/05/2020   PSA 0.9 10/03/2018    Advanced Directives Patient does have advanced directives including living will and healthcare power of attorney. He does not have a copy in the electronic medical record.   DEPRESSION SCREENING    08/02/2022    1:08 PM 12/06/2021    1:23 PM 08/09/2021    1:49 PM 06/29/2021    1:56 PM 05/02/2021    3:53 PM 09/29/2020    2:42 PM 12/23/2019    1:44 PM  PHQ 2/9 Scores  PHQ - 2 Score 0 0 0 0 0 0 0  PHQ- 9 Score 0 0         Diabetes: Managed by Dr. NDorris Fetch   Hyperlipidemia: Taking simvastatin and fish oil daily.   BPH: Taking tamsulosin twice daily.   Review of Systems  Constitutional:  Negative for chills, fever, malaise/fatigue and weight loss.  HENT:  Negative for congestion, ear discharge,  ear pain, nosebleeds, sinus pain, sore throat and tinnitus.   Eyes:  Negative for blurred vision, double vision, pain, discharge and redness.  Respiratory:  Negative for cough, shortness of breath and wheezing.   Cardiovascular:  Negative for chest pain, palpitations and leg swelling.  Gastrointestinal:  Negative for abdominal pain, constipation, diarrhea, heartburn, nausea and vomiting.  Genitourinary:  Negative for dysuria, frequency and urgency.        Denies trouble initiating a urine stream, weak stream, split stream, nocturia, and dribbling.   Musculoskeletal:  Negative for myalgias.  Skin:  Negative for rash.  Neurological:  Negative for dizziness, seizures, weakness and headaches.  Psychiatric/Behavioral:  Negative for depression, substance abuse and suicidal ideas. The patient is not nervous/anxious.      Current Outpatient Medications:    acetaminophen (TYLENOL) 650 MG CR tablet, Take 1,300 mg by mouth every 8 (eight) hours as needed for pain., Disp: , Rfl:    diphenhydramine-acetaminophen (TYLENOL PM) 25-500 MG TABS tablet, Take 1 tablet by mouth at bedtime as needed (sleep)., Disp: , Rfl:    GLUCOSAMINE-CHONDROITIN PO, Take 2 tablets by mouth daily as needed., Disp: , Rfl:    metFORMIN (GLUCOPHAGE) 500 MG tablet, Take 1 tablet (500 mg total) by mouth daily with breakfast., Disp: 90 tablet, Rfl: 1   Omega-3 Fatty Acids (FISH OIL ULTRA) 1400 MG CAPS, Take 1,400 mg by mouth daily., Disp: , Rfl:    ONETOUCH ULTRA test strip, CHECK BLOOD SUGAR ONCE ANDAY, Disp: 100 strip, Rfl: 2   oxymetazoline (AFRIN) 0.05 % nasal spray, Place 1 spray into both nostrils 2 (two) times daily as needed for congestion., Disp: , Rfl:    simvastatin (ZOCOR) 40 MG tablet, Take 40 mg by mouth at bedtime., Disp: , Rfl:    tamsulosin (FLOMAX) 0.4 MG CAPS capsule, Take 1 capsule (0.4 mg total) by mouth 2 (two) times daily., Disp: 180 capsule, Rfl: 1  Allergies  Allergen Reactions   Penicillins Other (See Comments)    Childhood Reaction     Past Medical History:  Diagnosis Date   DDD (degenerative disc disease), cervical    DDD (degenerative disc disease), lumbar    High cholesterol    Normal cardiac stress test 2005   RMSF [facility] spotted fever) 2013   Type 2 diabetes mellitus with stage 2 chronic kidney disease, without long-term current use of insulin ([place]) 09/16/2019    Past Surgical History:  Procedure Laterality Date   CATARACT  EXTRACTION Bilateral    COLONOSCOPY     COLONOSCOPY WITH PROPOFOL N/A 10/12/2021   Procedure: COLONOSCOPY WITH PROPOFOL;  Surgeon: Harvel Quale, MD;  [facility];  Service: Gastroenterology;  Laterality: N/A;  10:45   ESOPHAGOGASTRODUODENOSCOPY N/A 01/14/2014   Procedure: ESOPHAGOGASTRODUODENOSCOPY (EGD);  Surgeon: Rogene Houston, MD;  [facility];  Service: Endoscopy;  Laterality: N/A;  320   POLYPECTOMY  10/12/2021   Procedure: POLYPECTOMY INTESTINAL;  Surgeon: Harvel Quale, MD;  [facility];  Service: Gastroenterology;;   VASECTOMY      Family History  Problem Relation Age of Onset   Early death Mother    Heart disease Father    Diabetes Father    Hyperlipidemia Father    Hypertension Father    AAA (abdominal aortic aneurysm) Father    Peripheral vascular disease Father        amputation   Heart Problems Father    Hypertension Brother    Heart attack Brother  Social History   Socioeconomic History   Marital status: Married    Spouse name: Tena    Number of children: 0   Years of education: Not on file   Highest education level: Some college, no degree  Occupational History   Not on file  Tobacco Use   Smoking status: Former    Types: Cigarettes    Quit date: 1982    Years since quitting: 41.6   Smokeless tobacco: Never   Tobacco comments:    quit in 1982  Vaping Use   Vaping Use: Never used  Substance and Sexual Activity   Alcohol use: Not Currently    Alcohol/week: 7.0 standard drinks of alcohol    Types: 7 Standard drinks or equivalent per week   Drug use: No   Sexual activity: Not Currently    Partners: Female  Other Topics Concern   Not on file  Social History Narrative   Lives with [place] married since 1981      Pets: 1 boston terrier -deaf and one eye: Ladell Heads      Enjoys: hunt and being outside      Diet: eat all food groups   Caffeine: 4 cups of coffee   Water: 4 glasses a day       Wears seat belt    Smoke detectors    Does not use phone while driving   Social Determinants of Health   Financial Resource Strain: Low Risk  (05/02/2021)   Overall Financial Resource Strain (CARDIA)    Difficulty of Paying Living Expenses: Not hard at all  Food Insecurity: No Food Insecurity (05/02/2021)   Hunger Vital Sign    Worried About Running Out of Food in the Last Year: Never true    [place] in the Last Year: Never true  Transportation Needs: No Transportation Needs (05/02/2021)   PRAPARE - Hydrologist (Medical): No    Lack of Transportation (Non-Medical): No  Physical Activity: Sufficiently Active (05/02/2021)   Exercise Vital Sign    Days of Exercise per Week: 7 days    Minutes of Exercise per Session: 30 min  Stress: No Stress Concern Present (05/02/2021)   [place]    Feeling of Stress : Not at all  Social Connections: Moderately Integrated (05/02/2021)   Social Connection and Isolation Panel [NHANES]    Frequency of Communication with Friends and Family: Twice a week    Frequency of Social Gatherings with Friends and Family: Three times a week    Attends Religious Services: 1 to 4 times per year    Active Member of Clubs or Organizations: No    Attends Archivist Meetings: Never    Marital Status: Married  Human resources officer Violence: Not At Risk (05/02/2021)   Humiliation, Afraid, Rape, and Kick questionnaire    Fear of Current or Ex-Partner: No    Emotionally Abused: No    Physically Abused: No    Sexually Abused: No      Objective:    BP 137/89   Pulse 78   Temp 97.6 F (36.4 C) (Temporal)   Ht 5' 11"  (1.803 m)   Wt 231 lb 12.8 oz (105.1 kg)   SpO2 96%   BMI 32.33 kg/m   BP Readings from Last 3 Encounters:  08/02/22 137/89  02/28/22 136/90  12/06/21 123/74   Wt Readings from Last 3 Encounters:  08/02/22 231 lb 12.8  oz (105.1 kg)   02/28/22 232 lb 3.2 oz (105.3 kg)  12/06/21 230 lb 3.2 oz (104.4 kg)    Physical Exam Vitals reviewed.  Constitutional:      General: He is not in acute distress.    Appearance: Normal appearance. He is obese. He is not ill-appearing, toxic-appearing or diaphoretic.  HENT:     Head: Normocephalic and atraumatic.     Right Ear: Tympanic membrane, ear canal and external ear normal. There is no impacted cerumen.     Left Ear: Tympanic membrane, ear canal and external ear normal. There is no impacted cerumen.     Nose: Nose normal. No congestion or rhinorrhea.     Mouth/Throat:     Mouth: Mucous membranes are moist.     Pharynx: Oropharynx is clear. No oropharyngeal exudate or posterior oropharyngeal erythema.  Eyes:     General: No scleral icterus.       Right eye: No discharge.        Left eye: No discharge.     Conjunctiva/sclera: Conjunctivae normal.     Pupils: Pupils are equal, round, and reactive to light.  Neck:     Vascular: No carotid bruit.  Cardiovascular:     Rate and Rhythm: Normal rate and regular rhythm.     Heart sounds: Normal heart sounds. No murmur heard.    No friction rub. No gallop.  Pulmonary:     Effort: Pulmonary effort is normal. No respiratory distress.     Breath sounds: Normal breath sounds. No stridor. No wheezing, rhonchi or rales.  Abdominal:     General: Abdomen is flat. Bowel sounds are normal. There is no distension.     Palpations: Abdomen is soft. There is no hepatomegaly, splenomegaly or mass.     Tenderness: There is no abdominal tenderness. There is no guarding or rebound.     Hernia: No hernia is present.  Musculoskeletal:        General: Normal range of motion.     Cervical back: Normal range of motion and neck supple. No rigidity. No muscular tenderness.     Right lower leg: No edema.     Left lower leg: No edema.  Lymphadenopathy:     Cervical: No cervical adenopathy.  Skin:    General: Skin is warm and dry.     Capillary  Refill: Capillary refill takes less than 2 seconds.  Neurological:     General: No focal deficit present.     Mental Status: He is alert and oriented to person, place, and time. Mental status is at baseline.  Psychiatric:        Mood and Affect: Mood normal.        Behavior: Behavior normal.        Thought Content: Thought content normal.        Judgment: Judgment normal.     Lab Results  Component Value Date   TSH 2.380 07/14/2021   Lab Results  Component Value Date   WBC 4.2 07/14/2021   HGB 13.6 07/14/2021   HCT 40.1 07/14/2021   MCV 93 07/14/2021   PLT 167 07/14/2021   Lab Results  Component Value Date   NA 141 07/14/2021   K 4.8 07/14/2021   CO2 23 07/14/2021   GLUCOSE 139 (H) 07/14/2021   BUN 14 07/14/2021   CREATININE 1.17 07/14/2021   BILITOT 0.5 07/14/2021   ALKPHOS 56 07/14/2021   AST 17 07/14/2021   ALT 16 07/14/2021  PROT 6.8 07/14/2021   ALBUMIN 4.1 07/14/2021   CALCIUM 8.9 07/14/2021   EGFR 68 07/14/2021   Lab Results  Component Value Date   CHOL 198 07/14/2021   Lab Results  Component Value Date   HDL 39 (L) 07/14/2021   Lab Results  Component Value Date   LDLCALC 112 (H) 07/14/2021   Lab Results  Component Value Date   TRIG 268 (H) 07/14/2021   Lab Results  Component Value Date   CHOLHDL 5.1 (H) 07/14/2021   Lab Results  Component Value Date   HGBA1C 5.9 02/28/2022

## 2022-08-03 LAB — MICROALBUMIN / CREATININE URINE RATIO
Creatinine, Urine: 93.8 mg/dL
Microalb/Creat Ratio: 14 mg/g creat (ref 0–29)
Microalbumin, Urine: 13.4 ug/mL

## 2022-08-03 LAB — CMP14+EGFR
ALT: 21 IU/L (ref 0–44)
AST: 25 IU/L (ref 0–40)
Albumin/Globulin Ratio: 1.8 (ref 1.2–2.2)
Albumin: 4.8 g/dL (ref 3.9–4.9)
Alkaline Phosphatase: 63 IU/L (ref 44–121)
BUN/Creatinine Ratio: 14 (ref 10–24)
BUN: 17 mg/dL (ref 8–27)
Bilirubin Total: 0.5 mg/dL (ref 0.0–1.2)
CO2: 21 mmol/L (ref 20–29)
Calcium: 10 mg/dL (ref 8.6–10.2)
Chloride: 102 mmol/L (ref 96–106)
Creatinine, Ser: 1.24 mg/dL (ref 0.76–1.27)
Globulin, Total: 2.7 g/dL (ref 1.5–4.5)
Glucose: 97 mg/dL (ref 70–99)
Potassium: 4.6 mmol/L (ref 3.5–5.2)
Sodium: 141 mmol/L (ref 134–144)
Total Protein: 7.5 g/dL (ref 6.0–8.5)
eGFR: 63 mL/min/{1.73_m2} (ref >59)

## 2022-08-03 LAB — CBC WITH DIFFERENTIAL/PLATELET
Basophils Absolute: 0.1 10*3/uL (ref 0.0–0.2)
Basos: 1 %
EOS (ABSOLUTE): 0.3 10*3/uL (ref 0.0–0.4)
Eos: 4 %
Hematocrit: 43.3 % (ref 37.5–51.0)
Hemoglobin: 14.8 g/dL (ref 13.0–17.7)
Immature Grans (Abs): 0 10*3/uL (ref 0.0–0.1)
Immature Granulocytes: 0 %
Lymphocytes Absolute: 0.5 10*3/uL — ABNORMAL LOW (ref 0.7–3.1)
Lymphs: 7 %
MCH: 32.2 pg (ref 26.6–33.0)
MCHC: 34.2 g/dL (ref 31.5–35.7)
MCV: 94 fL (ref 79–97)
Monocytes Absolute: 0.4 10*3/uL (ref 0.1–0.9)
Monocytes: 6 %
Neutrophils Absolute: 5.8 10*3/uL (ref 1.4–7.0)
Neutrophils: 82 %
Platelets: 200 10*3/uL (ref 150–450)
RBC: 4.59 x10E6/uL (ref 4.14–5.80)
RDW: 13.1 % (ref 11.6–15.4)
WBC: 7.1 10*3/uL (ref 3.4–10.8)

## 2022-08-03 LAB — LIPID PANEL
Chol/HDL Ratio: 3.9 ratio (ref 0.0–5.0)
Cholesterol, Total: 152 mg/dL (ref 100–199)
HDL: 39 mg/dL — ABNORMAL LOW (ref >39)
LDL Chol Calc (NIH): 53 mg/dL (ref 0–99)
Triglycerides: 395 mg/dL — ABNORMAL HIGH (ref 0–149)
VLDL Cholesterol Cal: 60 mg/dL — ABNORMAL HIGH (ref 5–40)

## 2022-08-03 LAB — PSA, TOTAL AND FREE
PSA, Free Pct: 22.3 %
PSA, Free: 0.29 ng/mL
Prostate Specific Ag, Serum: 1.3 ng/mL (ref 0.0–4.0)

## 2022-08-23 ENCOUNTER — Telehealth: Payer: Self-pay | Admitting: Family Medicine

## 2022-08-23 NOTE — Telephone Encounter (Signed)
  Prescription Request  08/23/2022  Is this a "Controlled Substance" medicine? no  Have you seen your PCP in the last 2 weeks? yes  If YES, route message to pool  -  If NO, patient needs to be scheduled for appointment.  What is the name of the medication or equipment? RX for new Cpap machine bc he has had other one since 2018  Have you contacted your pharmacy to request a refill? yes   Which pharmacy would you like this sent to? [facility]?   Patient notified that their request is being sent to the clinical staff for review and that they should receive a response within 2 business days.   Joyce's pt. Please call pt.

## 2022-08-23 NOTE — Telephone Encounter (Signed)
TC to pt, made appt w/ Monia Pouch for 09/05/22, OV needed for documentation that pt is on CPAP & benefiting from use to place order for new machine. Will then fax order & OV notes to [facility]

## 2022-08-24 ENCOUNTER — Other Ambulatory Visit: Payer: Self-pay | Admitting: "Endocrinology

## 2022-08-31 ENCOUNTER — Encounter: Payer: Self-pay | Admitting: "Endocrinology

## 2022-08-31 ENCOUNTER — Ambulatory Visit: Payer: Medicare HMO | Admitting: "Endocrinology

## 2022-08-31 VITALS — BP 136/92 | HR 60 | Ht 71.0 in | Wt 233.2 lb

## 2022-08-31 DIAGNOSIS — E782 Mixed hyperlipidemia: Secondary | ICD-10-CM

## 2022-08-31 DIAGNOSIS — N182 Chronic kidney disease, stage 2 (mild): Secondary | ICD-10-CM

## 2022-08-31 DIAGNOSIS — Z6832 Body mass index (BMI) 32.0-32.9, adult: Secondary | ICD-10-CM

## 2022-08-31 DIAGNOSIS — E6609 Other obesity due to excess calories: Secondary | ICD-10-CM

## 2022-08-31 DIAGNOSIS — I10 Essential (primary) hypertension: Secondary | ICD-10-CM | POA: Insufficient documentation

## 2022-08-31 DIAGNOSIS — E1122 Type 2 diabetes mellitus with diabetic chronic kidney disease: Secondary | ICD-10-CM | POA: Diagnosis not present

## 2022-08-31 HISTORY — DX: Essential (primary) hypertension: I10

## 2022-08-31 LAB — POCT GLYCOSYLATED HEMOGLOBIN (HGB A1C): HbA1c, POC (controlled diabetic range): 6 % (ref 0.0–7.0)

## 2022-08-31 NOTE — Progress Notes (Signed)
08/31/2022, 6:59 PM  Endocrinology follow-up note   Subjective:    Patient ID: Andres Robinson, male    DOB: 03-01-1956.  Andres Robinson is being seen in  Follow up after he was seen in consultation for management of currently uncontrolled symptomatic diabetes requested by  Baruch Gouty, FNP.   Past Medical History:  Diagnosis Date   DDD (degenerative disc disease), cervical    DDD (degenerative disc disease), lumbar    High cholesterol    Normal cardiac stress test 2005   [facility] spotted fever) 2013   Type 2 diabetes mellitus with stage 2 chronic kidney disease, without long-term current use of insulin ([place]) 09/16/2019    Past Surgical History:  Procedure Laterality Date   CATARACT EXTRACTION Bilateral    COLONOSCOPY     COLONOSCOPY WITH PROPOFOL N/A 10/12/2021   Procedure: COLONOSCOPY WITH PROPOFOL;  Surgeon: Harvel Quale, MD;  [facility]: [facility];  Service: Gastroenterology;  Laterality: N/A;  10:45   ESOPHAGOGASTRODUODENOSCOPY N/A 01/14/2014   Procedure: ESOPHAGOGASTRODUODENOSCOPY (EGD);  Surgeon: Rogene Houston, MD;  Location: [facility];  Service: Endoscopy;  Laterality: N/A;  320   POLYPECTOMY  10/12/2021   Procedure: POLYPECTOMY INTESTINAL;  Surgeon: Montez Morita, Quillian Quince, MD;  Location: [facility];  Service: Gastroenterology;;   VASECTOMY      Social History   Socioeconomic History   Marital status: Married    Spouse name: Tena    Number of children: 0   Years of education: Not on file   Highest education level: Some college, no degree  Occupational History   Not on file  Tobacco Use   Smoking status: Former    Types: Cigarettes    Quit date: 1982    Years since quitting: 41.7   Smokeless tobacco: Never   Tobacco comments:    quit in 1982  Vaping Use   Vaping Use: Never used  Substance and Sexual Activity   Alcohol use: Not Currently    Alcohol/week: 7.0 standard drinks of alcohol     Types: 7 Standard drinks or equivalent per week   Drug use: No   Sexual activity: Not Currently    Partners: Female  Other Topics Concern   Not on file  Social History Narrative   Lives with Tena married since 1981      Pets: 1 boston terrier -deaf and one eye: Ladell Heads      Enjoys: hunt and being outside      Diet: eat all food groups   Caffeine: 4 cups of coffee   Water: 4 glasses a day      Wears seat belt    Smoke detectors    Does not use phone while driving   Social Determinants of Health   Financial Resource Strain: Low Risk  (05/02/2021)   Overall Financial Resource Strain (CARDIA)    Difficulty of Paying Living Expenses: Not hard at all  Food Insecurity: No Food Insecurity (05/02/2021)   Hunger Vital Sign    Worried About Running Out of Food in the Last Year: Never true    Fidelis in the Last Year: Never true  Transportation Needs: No Transportation Needs (05/02/2021)   PRAPARE - Hydrologist (Medical): No    Lack of Transportation (Non-Medical): No  Physical Activity: Sufficiently Active (05/02/2021)   Exercise Vital Sign    Days of Exercise per Week:  7 days    Minutes of Exercise per Session: 30 min  Stress: No Stress Concern Present (05/02/2021)   [place]    Feeling of Stress : Not at all  Social Connections: Moderately Integrated (05/02/2021)   Social Connection and Isolation Panel [NHANES]    Frequency of Communication with Friends and Family: Twice a week    Frequency of Social Gatherings with Friends and Family: Three times a week    Attends Religious Services: 1 to 4 times per year    Active Member of Clubs or Organizations: No    Attends Archivist Meetings: Never    Marital Status: Married    Family History  Problem Relation Age of Onset   Early death Mother    Heart disease Father    Diabetes Father    Hyperlipidemia Father     Hypertension Father    AAA (abdominal aortic aneurysm) Father    Peripheral vascular disease Father        amputation   Heart Problems Father    Hypertension Brother    Heart attack Brother     Outpatient Encounter Medications as of 08/31/2022  Medication Sig   acetaminophen (TYLENOL) 650 MG CR tablet Take 1,300 mg by mouth every 8 (eight) hours as needed for pain.   diphenhydramine-acetaminophen (TYLENOL PM) 25-500 MG TABS tablet Take 1 tablet by mouth at bedtime as needed (sleep).   GLUCOSAMINE-CHONDROITIN PO Take 2 tablets by mouth daily as needed.   metFORMIN (GLUCOPHAGE) 500 MG tablet TAKE ONE TABLET ONCE DAILY WITH BREAKFAST   Omega-3 Fatty Acids (FISH OIL ULTRA) 1400 MG CAPS Take 1,400 mg by mouth 2 (two) times daily with a meal.   ONETOUCH ULTRA test strip CHECK BLOOD SUGAR ONCE ANDAY   oxymetazoline (AFRIN) 0.05 % nasal spray Place 1 spray into both nostrils 2 (two) times daily as needed for congestion.   simvastatin (ZOCOR) 40 MG tablet Take 40 mg by mouth at bedtime.   tamsulosin (FLOMAX) 0.4 MG CAPS capsule Take 1 capsule (0.4 mg total) by mouth 2 (two) times daily.   No facility-administered encounter medications on file as of 08/31/2022.    ALLERGIES: Allergies  Allergen Reactions   Penicillins Other (See Comments)    Childhood Reaction     VACCINATION STATUS: Immunization History  Administered Date(s) Administered   Fluad Quad(high Dose 65+) 09/29/2020   Moderna Sars-Covid-2 Vaccination 02/19/2020, 03/19/2020, 01/18/2021   Tdap 10/07/2018    Diabetes He presents for his follow-up diabetic visit. He has type 2 diabetes mellitus. Onset time: He was diagnosed at approximate age of 88 years. His disease course has been improving. There are no hypoglycemic associated symptoms. Pertinent negatives for hypoglycemia include no confusion, headaches, pallor or seizures. There are no diabetic associated symptoms. Pertinent negatives for diabetes include no chest pain, no  fatigue, no polydipsia, no polyphagia, no polyuria and no weakness. There are no hypoglycemic complications. Symptoms are improving. There are no diabetic complications. Risk factors for coronary artery disease include diabetes mellitus, dyslipidemia, male sex, obesity, tobacco exposure, sedentary lifestyle and family history. Current diabetic treatment includes oral agent (monotherapy). His weight is fluctuating minimally. He is following a diabetic diet. He has not had a previous visit with a dietitian. He rarely participates in exercise. His home blood glucose trend is decreasing steadily. His breakfast blood glucose range is generally 110-130 mg/dl. His overall blood glucose range is 110-130 mg/dl. (Andres Robinson presents with controlled glycemic  profile.  His point-of-care A1c is 6% unchanged from last visit.  He did not document any hypoglycemia.   ) An ACE inhibitor/angiotensin II receptor blocker is not being taken. Eye exam is current.  Hyperlipidemia This is a chronic problem. The current episode started more than 1 year ago. The problem is uncontrolled. Exacerbating diseases include diabetes and obesity. Pertinent negatives include no chest pain, myalgias or shortness of breath. Current antihyperlipidemic treatment includes statins. Risk factors for coronary artery disease include dyslipidemia, diabetes mellitus, obesity, male sex and a sedentary lifestyle.     Review of Systems  Constitutional:  Negative for chills, fatigue, fever and unexpected weight change.  HENT:  Negative for dental problem, mouth sores and trouble swallowing.   Eyes:  Negative for visual disturbance.  Respiratory:  Negative for cough, choking, chest tightness, shortness of breath and wheezing.   Cardiovascular:  Negative for chest pain, palpitations and leg swelling.  Gastrointestinal:  Negative for abdominal distention, abdominal pain, constipation, diarrhea, nausea and vomiting.  Endocrine: Negative for polydipsia,  polyphagia and polyuria.  Genitourinary:  Negative for dysuria, flank pain, hematuria and urgency.  Musculoskeletal:  Negative for back pain, gait problem, myalgias and neck pain.  Skin:  Negative for pallor, rash and wound.  Neurological:  Negative for seizures, syncope, weakness, numbness and headaches.  Psychiatric/Behavioral:  Negative for confusion and dysphoric mood.     Objective:    BP (!) 136/92   Pulse 60   Ht '5\' 11"'$  (1.803 m)   Wt 233 lb 3.2 oz (105.8 kg)   BMI 32.52 kg/m   Wt Readings from Last 3 Encounters:  08/31/22 233 lb 3.2 oz (105.8 kg)  08/02/22 231 lb 12.8 oz (105.1 kg)  02/28/22 232 lb 3.2 oz (105.3 kg)        CMP     Component Value Date/Time   NA 141 08/02/2022 1336   K 4.6 08/02/2022 1336   CL 102 08/02/2022 1336   CO2 21 08/02/2022 1336   GLUCOSE 97 08/02/2022 1336   GLUCOSE 123 (H) 06/16/2020 0731   BUN 17 08/02/2022 1336   CREATININE 1.24 08/02/2022 1336   CREATININE 1.08 06/16/2020 0731   CALCIUM 10.0 08/02/2022 1336   PROT 7.5 08/02/2022 1336   ALBUMIN 4.8 08/02/2022 1336   AST 25 08/02/2022 1336   ALT 21 08/02/2022 1336   ALKPHOS 63 08/02/2022 1336   BILITOT 0.5 08/02/2022 1336   GFRNONAA 69 12/13/2020 1457   GFRNONAA 66 03/10/2020 0943   GFRAA 80 12/13/2020 1457   GFRAA 76 03/10/2020 0943     Diabetic Labs (most recent): Lab Results  Component Value Date   HGBA1C 6.0 08/31/2022   HGBA1C 5.9 02/28/2022   HGBA1C 6.0 10/28/2021     Lipid Panel ( most recent) Lipid Panel     Component Value Date/Time   CHOL 152 08/02/2022 1336   TRIG 395 (H) 08/02/2022 1336   HDL 39 (L) 08/02/2022 1336   CHOLHDL 3.9 08/02/2022 1336   CHOLHDL 2.8 03/10/2020 0943   LDLCALC 53 08/02/2022 1336   LDLCALC 55 03/10/2020 0943      Assessment & Plan:   1. Type 2 diabetes mellitus with stage 2 chronic kidney disease, without long-term current use of insulin (HCC)  - Dreyden Rohrman Lopp has currently controlled asymptomatic type 2 DM since  66  years of age.  Andres Robinson presents with controlled glycemic profile.  His point-of-care A1c is 6% unchanged from last visit.  He did not document  any hypoglycemia.      Recent labs reviewed. - I had a long discussion with him about the progressive nature of diabetes and the pathology behind its complications. -He does has mild microalbuminuria, no gross complications , however, he remains at a high risk for more acute and chronic complications which include CAD, CVA, CKD, retinopathy, and neuropathy. These are all discussed in detail with him.  - I have counseled him on diet  and weight management  by adopting a carbohydrate restricted/protein rich diet. Patient is encouraged to switch to  unprocessed or minimally processed     complex starch and increased protein intake (animal or plant source), fruits, and vegetables. -  he is advised to stick to a routine mealtimes to eat 3 meals  a day and avoid unnecessary snacks ( to snack only to correct hypoglycemia).   - he acknowledges that there is a room for improvement in his food and drink choices. - Suggestion is made for him to avoid simple carbohydrates  from his diet including Cakes, Sweet Desserts, Ice Cream, Soda (diet and regular), Sweet Tea, Candies, Chips, Cookies, Store Bought Juices, Alcohol , Artificial Sweeteners,  Coffee Creamer, and "Sugar-free" Products, Lemonade. This will help patient to have more stable blood glucose profile and potentially avoid unintended weight gain.  The following Lifestyle Medicine recommendations according to [place]  [facility]) were discussed and and offered to patient and he  agrees to start the journey:  A. Whole Foods, Plant-Based Nutrition comprising of fruits and vegetables, plant-based proteins, whole-grain carbohydrates was discussed in detail with the patient.   A list for source of those nutrients were also provided to the patient.  Patient will use only water or unsweetened  tea for hydration. B.  The need to stay away from risky substances including alcohol, smoking; obtaining 7 to 9 hours of restorative sleep, at least 150 minutes of moderate intensity exercise weekly, the importance of healthy social connections,  and stress management techniques were discussed. C.  A full color page of  Calorie density of various food groups per pound showing examples of each food groups was provided to the patient.   - he has been  scheduled with Jearld Fenton, RDN, CDE for diabetes education.  - I have approached him with the following individualized plan to manage  his diabetes and patient agrees:   - Based on his presentation with near target glycemic profile with A1c of 6%, he will not need insulin treatment for now.  He is advised to continue metformin 500 mg p.o. daily at breakfast.     -He has several options if he loses control of diabetes during his next visit, including glipizide therapy, insulin, incretin therapy as appropriate.  - Specific targets for  A1c;  LDL, HDL, Triglycerides,  were discussed with the patient.  2) Blood Pressure /Hypertension: His blood pressure is not controlled to target.   He is not on any antihypertensive medications.  The above described whole food plant-based diet will help with hypertension, will be considered for antihypertensive medications if he returns with out engagement in lifestyle medicine.  3) Lipids/Hyperlipidemia:   Review of his recent lipid panel showed controlled LDL, however uncontrolled hyperglycemia at 395.  Whole food plant-based diet will help with dyslipidemia.  He is advised to continue Zocor 40 mg p.o. nightly.  Side effects and precautions discussed with him.      4)  Weight/Diet:  Body mass index is 32.52 kg/m.  -  clearly complicating his diabetes care.   he is  a candidate for weight loss. I discussed with him the fact that loss of 5 - 10% of his  current body weight will have the most impact on his  diabetes management.  Exercise, and detailed carbohydrates information provided  -  detailed on discharge instructions.  5) Chronic Care/Health Maintenance:  -he  is on Statin medications and  is encouraged to initiate and continue to follow up with Ophthalmology, Dentist,  Podiatrist at least yearly or according to recommendations, and advised to   stay away from smoking. I have recommended yearly flu vaccine and pneumonia vaccine at least every 5 years; moderate intensity exercise for up to 150 minutes weekly; and  sleep for at least 7 hours a day.  His screening ABI was negative for PAD in September 2021, his study will be repeated in December 2026, or sooner if needed.   - he is  advised to maintain close follow up with Rakes, Connye Burkitt, FNP for primary care needs, as well as his other providers for optimal and coordinated care.    I spent 40 minutes in the care of the patient today including review of labs from [place], Lipids, Thyroid Function, Hematology (current and previous including abstractions from other facilities); face-to-face time discussing  his blood glucose readings/logs, discussing hypoglycemia and hyperglycemia episodes and symptoms, medications doses, his options of short and long term treatment based on the latest standards of care / guidelines;  discussion about incorporating lifestyle medicine;  and documenting the encounter. Risk reduction counseling performed per USPSTF guidelines to reduce  obesity and cardiovascular risk factors.     Please refer to Patient Instructions for Blood Glucose Monitoring and Insulin/Medications Dosing Guide"  in media tab for additional information. Please  also refer to " Patient Self Inventory" in the Media  tab for reviewed elements of pertinent patient history.  [name] participated in the discussions, expressed understanding, and voiced agreement with the above plans.  All questions were answered to his satisfaction. he is encouraged to  contact clinic should he have any questions or concerns prior to his return visit.   Follow up plan: - Return in about 6 months (around 03/01/2023) for Fasting Labs  in AM B4 8, A1c -NV.  Glade Lloyd, MD [facility] [address] [place], [place] [address] Phone: [phone]  Fax: [phone]    08/31/2022, 6:59 PM  This note was partially dictated with voice recognition software. Similar sounding words can be transcribed inadequately or may not  be corrected upon review.

## 2022-08-31 NOTE — Patient Instructions (Signed)
                                     Advice for Weight Management  -For most of us the best way to lose weight is by diet management. Generally speaking, diet management means consuming less calories intentionally which over time brings about progressive weight loss.  This can be achieved more effectively by avoiding ultra processed carbohydrates, processed meats, unhealthy fats.    It is critically important to know your numbers: how much calorie you are consuming and how much calorie you need. More importantly, our carbohydrates sources should be unprocessed naturally occurring  complex starch food items.  It is always important to balance nutrition also by  appropriate intake of proteins (mainly plant-based), healthy fats/oils, plenty of fruits and vegetables.   -The American College of Lifestyle Medicine (ACL M) recommends nutrition derived mostly from Whole Food, Plant Predominant Sources example an apple instead of applesauce or apple pie. Eat Plenty of vegetables, Mushrooms, fruits, Legumes, Whole Grains, Nuts, seeds in lieu of processed meats, processed snacks/pastries red meat, poultry, eggs.  Use only water or unsweetened tea for hydration.  The College also recommends the need to stay away from risky substances including alcohol, smoking; obtaining 7-9 hours of restorative sleep, at least 150 minutes of moderate intensity exercise weekly, importance of healthy social connections, and being mindful of stress and seek help when it is overwhelming.    -Sticking to a routine mealtime to eat 3 meals a day and avoiding unnecessary snacks is shown to have a big role in weight control. Under normal circumstances, the only time we burn stored energy is when we are hungry, so allow  some hunger to take place- hunger means no food between appropriate meal times, only water.  It is not advisable to starve.   -It is better to avoid simple carbohydrates including:  Cakes, Sweet Desserts, Ice Cream, Soda (diet and regular), Sweet Tea, Candies, Chips, Cookies, Store Bought Juices, Alcohol in Excess of  1-2 drinks a day, Lemonade,  Artificial Sweeteners, Doughnuts, Coffee Creamers, "Sugar-free" Products, etc, etc.  This is not a complete list.....    -Consulting with certified diabetes educators is proven to provide you with the most accurate and current information on diet.  Also, you may be  interested in discussing diet options/exchanges , we can schedule a visit with Andres Robinson, RDN, CDE for individualized nutrition education.  -Exercise: If you are able: 30 -60 minutes a day ,4 days a week, or 150 minutes of moderate intensity exercise weekly.    The longer the better if tolerated.  Combine stretch, strength, and aerobic activities.  If you were told in the past that you have high risk for cardiovascular diseases, or if you are currently symptomatic, you may seek evaluation by your heart doctor prior to initiating moderate to intense exercise programs.                                  Additional Care Considerations for Diabetes/Prediabetes   -Diabetes  is a chronic disease.  The most important care consideration is regular follow-up with your diabetes care provider with the goal being avoiding or delaying its complications and to take advantage of advances in medications and technology.  If appropriate actions are taken early enough, type 2 diabetes can even be   reversed.  Seek information from the right source.  - Whole Food, Plant Predominant Nutrition is highly recommended: Eat Plenty of vegetables, Mushrooms, fruits, Legumes, Whole Grains, Nuts, seeds in lieu of processed meats, processed snacks/pastries red meat, poultry, eggs as recommended by American College of  Lifestyle Medicine (ACLM).  -Type 2 diabetes is known to coexist with other important comorbidities such as high blood pressure and high cholesterol.  It is critical to control not only the  diabetes but also the high blood pressure and high cholesterol to minimize and delay the risk of complications including coronary artery disease, stroke, amputations, blindness, etc.  The good news is that this diet recommendation for type 2 diabetes is also very helpful for managing high cholesterol and high blood blood pressure.  - Studies showed that people with diabetes will benefit from a class of medications known as ACE inhibitors and statins.  Unless there are specific reasons not to be on these medications, the standard of care is to consider getting one from these groups of medications at an optimal doses.  These medications are generally considered safe and proven to help protect the heart and the kidneys.    - People with diabetes are encouraged to initiate and maintain regular follow-up with eye doctors, foot doctors, dentists , and if necessary heart and kidney doctors.     - It is highly recommended that people with diabetes quit smoking or stay away from smoking, and get yearly  flu vaccine and pneumonia vaccine at least every 5 years.  See above for additional recommendations on exercise, sleep, stress management , and healthy social connections.      

## 2022-09-05 ENCOUNTER — Encounter: Payer: Self-pay | Admitting: Family Medicine

## 2022-09-05 ENCOUNTER — Ambulatory Visit (INDEPENDENT_AMBULATORY_CARE_PROVIDER_SITE_OTHER): Payer: Medicare HMO | Admitting: Family Medicine

## 2022-09-05 VITALS — BP 137/85 | HR 63 | Temp 98.5°F | Ht 71.0 in | Wt 235.0 lb

## 2022-09-05 DIAGNOSIS — Z9989 Dependence on other enabling machines and devices: Secondary | ICD-10-CM

## 2022-09-05 DIAGNOSIS — G4733 Obstructive sleep apnea (adult) (pediatric): Secondary | ICD-10-CM

## 2022-09-05 NOTE — Progress Notes (Addendum)
Subjective:  Patient ID: Andres Robinson, male    DOB: Jul 26, 1956, 66 y.o.   MRN: [id number]  Patient Care Team: Baruch Gouty, FNP as PCP - General (Family Medicine) Cassandria Anger, MD as Consulting Physician (Endocrinology)   Chief Complaint:  Establish Care (Needs new CPAP machine/Med mgmt of chronic condition)   HPI: Andres Robinson is a 66 y.o. male presenting on 09/05/2022 for Establish Care (Needs new CPAP machine/Med mgmt of chronic condition)    1. OSA on CPAP Pt has been doing great on current CPAP settings. Uses machine every night for at least 5 hours. States machine stopped working this past weekend, unable to maintain pressures. States he needs a new machine with supplies.      Relevant past medical, surgical, family, and social history reviewed and updated as indicated.  Allergies and medications reviewed and updated. Data reviewed: Chart in Epic.   Past Medical History:  Diagnosis Date   DDD (degenerative disc disease), cervical    DDD (degenerative disc disease), lumbar    Essential hypertension, benign 08/31/2022   High cholesterol    Normal cardiac stress test 2005   [facility] spotted fever) 2013   Type 2 diabetes mellitus with stage 2 chronic kidney disease, without long-term current use of insulin ([place]) 09/16/2019    Past Surgical History:  Procedure Laterality Date   CATARACT EXTRACTION Bilateral    COLONOSCOPY     COLONOSCOPY WITH PROPOFOL N/A 10/12/2021   Procedure: COLONOSCOPY WITH PROPOFOL;  Surgeon: Harvel Quale, MD;  Location: [facility];  Service: Gastroenterology;  Laterality: N/A;  10:45   ESOPHAGOGASTRODUODENOSCOPY N/A 01/14/2014   Procedure: ESOPHAGOGASTRODUODENOSCOPY (EGD);  Surgeon: Rogene Houston, MD;  Location: [facility];  Service: Endoscopy;  Laterality: N/A;  320   POLYPECTOMY  10/12/2021   Procedure: POLYPECTOMY INTESTINAL;  Surgeon: Montez Morita, Quillian Quince, MD;  Location: [facility];   Service: Gastroenterology;;   VASECTOMY      Social History   Socioeconomic History   Marital status: Married    Spouse name: Tena    Number of children: 0   Years of education: Not on file   Highest education level: Some college, no degree  Occupational History   Not on file  Tobacco Use   Smoking status: Former    Types: Cigarettes    Quit date: 1982    Years since quitting: 41.7   Smokeless tobacco: Never   Tobacco comments:    quit in 1982  Vaping Use   Vaping Use: Never used  Substance and Sexual Activity   Alcohol use: Not Currently    Alcohol/week: 7.0 standard drinks of alcohol    Types: 7 Standard drinks or equivalent per week   Drug use: No   Sexual activity: Not Currently    Partners: Female  Other Topics Concern   Not on file  Social History Narrative   Lives with Tena married since 1981      Pets: 1 boston terrier -deaf and one eye: Ladell Heads      Enjoys: hunt and being outside      Diet: eat all food groups   Caffeine: 4 cups of coffee   Water: 4 glasses a day      Wears seat belt    Smoke detectors    Does not use phone while driving   Social Determinants of Health   Financial Resource Strain: Low Risk  (05/02/2021)   Overall Financial  Resource Strain (CARDIA)    Difficulty of Paying Living Expenses: Not hard at all  Food Insecurity: No Food Insecurity (05/02/2021)   Hunger Vital Sign    Worried About Running Out of Food in the Last Year: Never true    Ran Out of Food in the Last Year: Never true  Transportation Needs: No Transportation Needs (05/02/2021)   PRAPARE - Hydrologist (Medical): No    Lack of Transportation (Non-Medical): No  Physical Activity: Sufficiently Active (05/02/2021)   Exercise Vital Sign    Days of Exercise per Week: 7 days    Minutes of Exercise per Session: 30 min  Stress: No Stress Concern Present (05/02/2021)   Catharine     Feeling of Stress : Not at all  Social Connections: Moderately Integrated (05/02/2021)   Social Connection and Isolation Panel [NHANES]    Frequency of Communication with Friends and Family: Twice a week    Frequency of Social Gatherings with Friends and Family: Three times a week    Attends Religious Services: 1 to 4 times per year    Active Member of Clubs or Organizations: No    Attends Archivist Meetings: Never    Marital Status: Married  Human resources officer Violence: Not At Risk (05/02/2021)   Humiliation, Afraid, Rape, and Kick questionnaire    Fear of Current or Ex-Partner: No    Emotionally Abused: No    Physically Abused: No    Sexually Abused: No    Outpatient Encounter Medications as of 09/05/2022  Medication Sig   acetaminophen (TYLENOL) 650 MG CR tablet Take 1,300 mg by mouth every 8 (eight) hours as needed for pain.   diphenhydramine-acetaminophen (TYLENOL PM) 25-500 MG TABS tablet Take 1 tablet by mouth at bedtime as needed (sleep).   GLUCOSAMINE-CHONDROITIN PO Take 2 tablets by mouth daily as needed.   metFORMIN (GLUCOPHAGE) 500 MG tablet TAKE ONE TABLET ONCE DAILY WITH BREAKFAST   Omega-3 Fatty Acids (FISH OIL ULTRA) 1400 MG CAPS Take 1,400 mg by mouth 2 (two) times daily with a meal.   ONETOUCH ULTRA test strip CHECK BLOOD SUGAR ONCE ANDAY   oxymetazoline (AFRIN) 0.05 % nasal spray Place 1 spray into both nostrils 2 (two) times daily as needed for congestion.   simvastatin (ZOCOR) 40 MG tablet Take 40 mg by mouth at bedtime.   tamsulosin (FLOMAX) 0.4 MG CAPS capsule Take 1 capsule (0.4 mg total) by mouth 2 (two) times daily.   No facility-administered encounter medications on file as of 09/05/2022.    Allergies  Allergen Reactions   Penicillins Other (See Comments)    Childhood Reaction     Review of Systems  Constitutional:  Positive for fatigue. Negative for activity change, appetite change, chills, diaphoresis, fever and unexpected weight change.  HENT:  Negative.  Negative for congestion.   Eyes: Negative.   Respiratory:  Negative for cough, chest tightness and shortness of breath.        Sleep apnea  Cardiovascular:  Negative for chest pain, palpitations and leg swelling.  Gastrointestinal:  Negative for abdominal pain, blood in stool, constipation, diarrhea, nausea and vomiting.  Endocrine: Negative.   Genitourinary:  Negative for decreased urine volume, difficulty urinating, dysuria, frequency and urgency.  Musculoskeletal:  Positive for arthralgias. Negative for myalgias.  Skin: Negative.   Allergic/Immunologic: Negative.   Neurological:  Negative for dizziness, weakness and headaches.  Hematological: Negative.   Psychiatric/Behavioral:  Negative  for agitation, confusion, hallucinations, sleep disturbance and suicidal ideas.   All other systems reviewed and are negative.       Objective:  BP 137/85   Pulse 63   Temp 98.5 F (36.9 C)   Ht '5\' 11"'$  (1.803 m)   Wt 235 lb (106.6 kg)   SpO2 98%   BMI 32.78 kg/m    Wt Readings from Last 3 Encounters:  09/05/22 235 lb (106.6 kg)  08/31/22 233 lb 3.2 oz (105.8 kg)  08/02/22 231 lb 12.8 oz (105.1 kg)    Physical Exam Vitals and nursing note reviewed.  Constitutional:      General: He is not in acute distress.    Appearance: Normal appearance. He is obese. He is not ill-appearing, toxic-appearing or diaphoretic.  HENT:     Head: Normocephalic and atraumatic.     Nose: Nose normal.  Eyes:     Conjunctiva/sclera: Conjunctivae normal.     Pupils: Pupils are equal, round, and reactive to light.  Neck:     Vascular: No carotid bruit.  Cardiovascular:     Rate and Rhythm: Normal rate and regular rhythm.     Heart sounds: Normal heart sounds. No murmur heard.    No friction rub. No gallop.  Pulmonary:     Effort: Pulmonary effort is normal.     Breath sounds: Normal breath sounds.  Musculoskeletal:     Cervical back: Normal range of motion and neck supple.   Lymphadenopathy:     Cervical: No cervical adenopathy.  Skin:    General: Skin is warm and dry.     Capillary Refill: Capillary refill takes less than 2 seconds.  Neurological:     General: No focal deficit present.     Mental Status: He is alert and oriented to person, place, and time.  Psychiatric:        Mood and Affect: Mood normal.        Behavior: Behavior normal.        Thought Content: Thought content normal.        Judgment: Judgment normal.     Results for orders placed or performed in visit on 08/31/22  HgB A1c  Result Value Ref Range   Hemoglobin A1C     HbA1c POC (<> result, manual entry)     HbA1c, POC (prediabetic range)     HbA1c, POC (controlled diabetic range) 6.0 0.0 - 7.0 %       Pertinent labs & imaging results that were available during my care of the patient were reviewed by me and considered in my medical decision making.  Assessment & Plan:  Keeyon was seen today for establish care.  Diagnoses and all orders for this visit:  OSA on CPAP Need for new CPAP machine as his quit working over the weekend. Will send in for new machine with mask and supplies today.  -     For home use only DME Other see comment     Continue all other maintenance medications.  Follow up plan: Return in about 6 months (around 03/06/2023), or if symptoms worsen or fail to improve, for chronic management .   Continue healthy lifestyle choices, including diet (rich in fruits, vegetables, and lean proteins, and low in salt and simple carbohydrates) and exercise (at least 30 minutes of moderate physical activity daily).  Educational handout given for sleep apnea  The above assessment and management plan was discussed with the patient. The patient verbalized understanding of and has  agreed to the management plan. Patient is aware to call the clinic if they develop any new symptoms or if symptoms persist or worsen. Patient is aware when to return to the clinic for a follow-up  visit. Patient educated on when it is appropriate to go to the emergency department.   Monia Pouch, FNP-C [facility] [phone]

## 2022-09-22 ENCOUNTER — Encounter: Payer: Self-pay | Admitting: Family Medicine

## 2022-10-11 ENCOUNTER — Encounter: Payer: Self-pay | Admitting: Family Medicine

## 2022-10-12 ENCOUNTER — Encounter: Payer: Self-pay | Admitting: Family Medicine

## 2022-10-12 NOTE — Telephone Encounter (Signed)
Pt returned missed call. Said he believes hes now on 14 for sleep.

## 2022-10-12 NOTE — Telephone Encounter (Signed)
LMOVM for pt to call me back, in looking over chart I saw a pressure setting of 11 cm from when he had his sleep study done in 2018. Please call back to confirm setting or let me know what setting he is on.

## 2022-10-12 NOTE — Addendum Note (Signed)
Addended by: Antonietta Barcelona D on: 10/12/2022 04:11 PM   Modules accepted: Orders

## 2022-10-13 NOTE — Telephone Encounter (Signed)
Pt called in, gave me fax # to [facility], faxed CPAP order to [phone]

## 2022-10-30 ENCOUNTER — Other Ambulatory Visit: Payer: Self-pay | Admitting: Family Medicine

## 2022-10-30 DIAGNOSIS — N401 Enlarged prostate with lower urinary tract symptoms: Secondary | ICD-10-CM

## 2022-10-31 ENCOUNTER — Other Ambulatory Visit: Payer: Self-pay | Admitting: Family Medicine

## 2022-10-31 DIAGNOSIS — N182 Chronic kidney disease, stage 2 (mild): Secondary | ICD-10-CM

## 2022-10-31 DIAGNOSIS — E782 Mixed hyperlipidemia: Secondary | ICD-10-CM

## 2022-11-13 ENCOUNTER — Encounter (INDEPENDENT_AMBULATORY_CARE_PROVIDER_SITE_OTHER): Payer: Self-pay | Admitting: Gastroenterology

## 2022-11-24 ENCOUNTER — Other Ambulatory Visit: Payer: Self-pay | Admitting: "Endocrinology

## 2023-01-31 ENCOUNTER — Other Ambulatory Visit: Payer: Self-pay | Admitting: Family Medicine

## 2023-01-31 DIAGNOSIS — E1122 Type 2 diabetes mellitus with diabetic chronic kidney disease: Secondary | ICD-10-CM

## 2023-01-31 DIAGNOSIS — E782 Mixed hyperlipidemia: Secondary | ICD-10-CM

## 2023-01-31 DIAGNOSIS — N401 Enlarged prostate with lower urinary tract symptoms: Secondary | ICD-10-CM

## 2023-01-31 NOTE — Telephone Encounter (Signed)
30 day supply sent into pharmacy-pt ntbs for chronic follow up with pcp for further refills.   Please schedule around 3/12

## 2023-02-01 NOTE — Telephone Encounter (Signed)
Pt scheduled for med refill on 03/01/2023 and is aware.

## 2023-02-22 LAB — LIPID PANEL
Chol/HDL Ratio: 3.3 ratio (ref 0.0–5.0)
Cholesterol, Total: 134 mg/dL (ref 100–199)
HDL: 41 mg/dL (ref >39)
LDL Chol Calc (NIH): 54 mg/dL (ref 0–99)
Triglycerides: 248 mg/dL — ABNORMAL HIGH (ref 0–149)
VLDL Cholesterol Cal: 39 mg/dL (ref 5–40)

## 2023-02-23 ENCOUNTER — Other Ambulatory Visit: Payer: Self-pay | Admitting: "Endocrinology

## 2023-02-28 ENCOUNTER — Telehealth: Payer: Self-pay | Admitting: Family Medicine

## 2023-02-28 NOTE — Telephone Encounter (Signed)
Contacted [name] to schedule their annual wellness visit. Appointment made for 03/02/2023. Thank you,  Colletta Maryland,  [facility] Direct Dial ??[phone]

## 2023-03-01 ENCOUNTER — Ambulatory Visit: Payer: Medicare HMO | Admitting: Family Medicine

## 2023-03-01 ENCOUNTER — Encounter: Payer: Self-pay | Admitting: "Endocrinology

## 2023-03-01 ENCOUNTER — Ambulatory Visit: Payer: Medicare HMO | Admitting: "Endocrinology

## 2023-03-01 VITALS — BP 132/84 | HR 68 | Ht 71.0 in | Wt 235.2 lb

## 2023-03-01 DIAGNOSIS — E1122 Type 2 diabetes mellitus with diabetic chronic kidney disease: Secondary | ICD-10-CM | POA: Diagnosis not present

## 2023-03-01 DIAGNOSIS — I10 Essential (primary) hypertension: Secondary | ICD-10-CM | POA: Diagnosis not present

## 2023-03-01 DIAGNOSIS — N182 Chronic kidney disease, stage 2 (mild): Secondary | ICD-10-CM

## 2023-03-01 DIAGNOSIS — E782 Mixed hyperlipidemia: Secondary | ICD-10-CM

## 2023-03-01 LAB — POCT GLYCOSYLATED HEMOGLOBIN (HGB A1C): HbA1c, POC (controlled diabetic range): 6.3 % (ref 0.0–7.0)

## 2023-03-01 NOTE — Progress Notes (Signed)
03/01/2023, 3:29 PM  Endocrinology follow-up note   Subjective:    Patient ID: Andres Robinson, male    DOB: [birth year].  Andres Robinson is being seen in  Follow up after he was seen in consultation for management of currently uncontrolled symptomatic diabetes requested by  Baruch Gouty, FNP.   Past Medical History:  Diagnosis Date   DDD (degenerative disc disease), cervical    DDD (degenerative disc disease), lumbar    Essential hypertension, benign 08/31/2022   High cholesterol    Normal cardiac stress test 2005   [facility] spotted fever) 2013   Type 2 diabetes mellitus with stage 2 chronic kidney disease, without long-term current use of insulin ([place]) 09/16/2019    Past Surgical History:  Procedure Laterality Date   CATARACT EXTRACTION Bilateral    COLONOSCOPY     COLONOSCOPY WITH PROPOFOL N/A 10/12/2021   Procedure: COLONOSCOPY WITH PROPOFOL;  Surgeon: Harvel Quale, MD;  [facility];  Service: Gastroenterology;  Laterality: N/A;  10:45   ESOPHAGOGASTRODUODENOSCOPY N/A 01/14/2014   Procedure: ESOPHAGOGASTRODUODENOSCOPY (EGD);  Surgeon: Rogene Houston, MD;  Location: [facility];  Service: Endoscopy;  Laterality: N/A;  320   POLYPECTOMY  10/12/2021   Procedure: POLYPECTOMY INTESTINAL;  Surgeon: Montez Morita, Quillian Quince, MD;  Location: [facility];  Service: Gastroenterology;;   VASECTOMY      Social History   Socioeconomic History   Marital status: Married    Spouse name: Tena    Number of children: 0   Years of education: Not on file   Highest education level: Some college, no degree  Occupational History   Not on file  Tobacco Use   Smoking status: Former    Types: Cigarettes    Quit date: 1982    Years since quitting: 42.2   Smokeless tobacco: Never   Tobacco comments:    quit in 1982  Vaping Use   Vaping Use: Never used  Substance and Sexual Activity   Alcohol use: Not Currently     Alcohol/week: 7.0 standard drinks of alcohol    Types: 7 Standard drinks or equivalent per week   Drug use: No   Sexual activity: Not Currently    Partners: Female  Other Topics Concern   Not on file  Social History Narrative   Lives with Tena married since 1981      Pets: 1 boston terrier -deaf and one eye: Ladell Heads      Enjoys: hunt and being outside      Diet: eat all food groups   Caffeine: 4 cups of coffee   Water: 4 glasses a day      Wears seat belt    Smoke detectors    Does not use phone while driving   Social Determinants of Health   Financial Resource Strain: Low Risk  (05/02/2021)   Overall Financial Resource Strain (CARDIA)    Difficulty of Paying Living Expenses: Not hard at all  Food Insecurity: No Food Insecurity (05/02/2021)   Hunger Vital Sign    Worried About Running Out of Food in the Last Year: Never true    [place] in the Last Year: Never true  Transportation Needs: No Transportation Needs (05/02/2021)   PRAPARE - Hydrologist (Medical): No    Lack of Transportation (Non-Medical): No  Physical Activity: Sufficiently Active (05/02/2021)   Exercise Vital Sign  Days of Exercise per Week: 7 days    Minutes of Exercise per Session: 30 min  Stress: No Stress Concern Present (05/02/2021)   [place]    Feeling of Stress : Not at all  Social Connections: Moderately Integrated (05/02/2021)   Social Connection and Isolation Panel [NHANES]    Frequency of Communication with Friends and Family: Twice a week    Frequency of Social Gatherings with Friends and Family: Three times a week    Attends Religious Services: 1 to 4 times per year    Active Member of Clubs or Organizations: No    Attends Archivist Meetings: Never    Marital Status: Married    Family History  Problem Relation Age of Onset   Early death Mother    Heart disease Father    Diabetes  Father    Hyperlipidemia Father    Hypertension Father    AAA (abdominal aortic aneurysm) Father    Peripheral vascular disease Father        amputation   Heart Problems Father    Hypertension Brother    Heart attack Brother     Outpatient Encounter Medications as of 03/01/2023  Medication Sig   acetaminophen (TYLENOL) 650 MG CR tablet Take 1,300 mg by mouth every 8 (eight) hours as needed for pain.   diphenhydramine-acetaminophen (TYLENOL PM) 25-500 MG TABS tablet Take 1 tablet by mouth at bedtime as needed (sleep).   GLUCOSAMINE-CHONDROITIN PO Take 2 tablets by mouth daily as needed.   metFORMIN (GLUCOPHAGE) 500 MG tablet TAKE ONE TABLET ONCE DAILY WITH BREAKFAST   Omega-3 Fatty Acids (FISH OIL ULTRA) 1400 MG CAPS Take 1,400 mg by mouth 2 (two) times daily with a meal.   ONETOUCH ULTRA test strip CHECK BLOOD SUGAR ONCE ANDAY   oxymetazoline (AFRIN) 0.05 % nasal spray Place 1 spray into both nostrils 2 (two) times daily as needed for congestion.   simvastatin (ZOCOR) 40 MG tablet TAKE ONE TABLET DAILY AT 6PM   tamsulosin (FLOMAX) 0.4 MG CAPS capsule TAKE ONE CAPSULE BY MOUTH TWICE DAILY   No facility-administered encounter medications on file as of 03/01/2023.    ALLERGIES: Allergies  Allergen Reactions   Penicillins Other (See Comments)    Childhood Reaction     VACCINATION STATUS: Immunization History  Administered Date(s) Administered   Fluad Quad(high Dose 65+) 09/29/2020   Moderna Sars-Covid-2 Vaccination 02/19/2020, 03/19/2020, 01/18/2021   Tdap 10/07/2018    Diabetes He presents for his follow-up diabetic visit. He has type 2 diabetes mellitus. Onset time: He was diagnosed at approximate age of 67 years. His disease course has been stable. There are no hypoglycemic associated symptoms. Pertinent negatives for hypoglycemia include no confusion, headaches, pallor or seizures. There are no diabetic associated symptoms. Pertinent negatives for diabetes include no chest pain,  no fatigue, no polydipsia, no polyphagia, no polyuria and no weakness. There are no hypoglycemic complications. Symptoms are improving. There are no diabetic complications. Risk factors for coronary artery disease include diabetes mellitus, dyslipidemia, male sex, obesity, tobacco exposure, sedentary lifestyle and family history. Current diabetic treatment includes oral agent (monotherapy). His weight is fluctuating minimally. He is following a diabetic diet. He has not had a previous visit with a dietitian. He rarely participates in exercise. His home blood glucose trend is decreasing steadily. His breakfast blood glucose range is generally 130-140 mg/dl. His overall blood glucose range is 130-140 mg/dl. (Andres Robinson presents with continued controlled glycemic  profile with point-of-care A1c of 6.3%.  He did not document or report hypoglycemia.   ) An ACE inhibitor/angiotensin II receptor blocker is not being taken. Eye exam is current.  Hyperlipidemia This is a chronic problem. The current episode started more than 1 year ago. The problem is uncontrolled. Exacerbating diseases include diabetes and obesity. Pertinent negatives include no chest pain, myalgias or shortness of breath. Current antihyperlipidemic treatment includes statins. Risk factors for coronary artery disease include dyslipidemia, diabetes mellitus, obesity, male sex and a sedentary lifestyle.     Review of Systems  Constitutional:  Negative for chills, fatigue, fever and unexpected weight change.  HENT:  Negative for dental problem, mouth sores and trouble swallowing.   Eyes:  Negative for visual disturbance.  Respiratory:  Negative for cough, choking, chest tightness, shortness of breath and wheezing.   Cardiovascular:  Negative for chest pain, palpitations and leg swelling.  Gastrointestinal:  Negative for abdominal distention, abdominal pain, constipation, diarrhea, nausea and vomiting.  Endocrine: Negative for polydipsia,  polyphagia and polyuria.  Genitourinary:  Negative for dysuria, flank pain, hematuria and urgency.  Musculoskeletal:  Negative for back pain, gait problem, myalgias and neck pain.  Skin:  Negative for pallor, rash and wound.  Neurological:  Negative for seizures, syncope, weakness, numbness and headaches.  Psychiatric/Behavioral:  Negative for confusion and dysphoric mood.     Objective:    BP 132/84   Pulse 68   Ht '5\' 11"'$  (1.803 m)   Wt 235 lb 3.2 oz (106.7 kg)   BMI 32.80 kg/m   Wt Readings from Last 3 Encounters:  03/01/23 235 lb 3.2 oz (106.7 kg)  09/05/22 235 lb (106.6 kg)  08/31/22 233 lb 3.2 oz (105.8 kg)        CMP     Component Value Date/Time   NA 141 08/02/2022 1336   K 4.6 08/02/2022 1336   CL 102 08/02/2022 1336   CO2 21 08/02/2022 1336   GLUCOSE 97 08/02/2022 1336   GLUCOSE 123 (H) 06/16/2020 0731   BUN 17 08/02/2022 1336   CREATININE 1.24 08/02/2022 1336   CREATININE 1.08 06/16/2020 0731   CALCIUM 10.0 08/02/2022 1336   PROT 7.5 08/02/2022 1336   ALBUMIN 4.8 08/02/2022 1336   AST 25 08/02/2022 1336   ALT 21 08/02/2022 1336   ALKPHOS 63 08/02/2022 1336   BILITOT 0.5 08/02/2022 1336   GFRNONAA 69 12/13/2020 1457   GFRNONAA 66 03/10/2020 0943   GFRAA 80 12/13/2020 1457   GFRAA 76 03/10/2020 0943     Diabetic Labs (most recent): Lab Results  Component Value Date   HGBA1C 6.3 03/01/2023   HGBA1C 6.0 08/31/2022   HGBA1C 5.9 02/28/2022     Lipid Panel ( most recent) Lipid Panel     Component Value Date/Time   CHOL 134 02/21/2023 0817   TRIG 248 (H) 02/21/2023 0817   HDL 41 02/21/2023 0817   CHOLHDL 3.3 02/21/2023 0817   CHOLHDL 2.8 03/10/2020 0943   LDLCALC 54 02/21/2023 0817   LDLCALC 55 03/10/2020 0943      Assessment & Plan:   1. Type 2 diabetes mellitus with stage 2 chronic kidney disease, without long-term current use of insulin (HCC)  - Andres Robinson has currently controlled asymptomatic type 2 DM since  67 years of  age.  Andres Robinson presents with continued controlled glycemic profile with point-of-care A1c of 6.3%.  He did not document or report hypoglycemia.     Recent labs reviewed. - I  had a long discussion with him about the progressive nature of diabetes and the pathology behind its complications. -He does has mild microalbuminuria, no gross complications , however, he remains at a high risk for more acute and chronic complications which include CAD, CVA, CKD, retinopathy, and neuropathy. These are all discussed in detail with him.  - I have counseled him on diet  and weight management  by adopting a carbohydrate restricted/protein rich diet. Patient is encouraged to switch to  unprocessed or minimally processed     complex starch and increased protein intake (animal or plant source), fruits, and vegetables. -  he is advised to stick to a routine mealtimes to eat 3 meals  a day and avoid unnecessary snacks ( to snack only to correct hypoglycemia).  - he acknowledges that there is a room for improvement in his food and drink choices. - Suggestion is made for him to avoid simple carbohydrates  from his diet including Cakes, Sweet Desserts, Ice Cream, Soda (diet and regular), Sweet Tea, Candies, Chips, Cookies, Store Bought Juices, Alcohol , Artificial Sweeteners,  Coffee Creamer, and "Sugar-free" Products, Lemonade. This will help patient to have more stable blood glucose profile and potentially avoid unintended weight gain.  The following Lifestyle Medicine recommendations according to [place]  [facility]) were discussed and and offered to patient and he  agrees to start the journey:  A. Whole Foods, Plant-Based Nutrition comprising of fruits and vegetables, plant-based proteins, whole-grain carbohydrates was discussed in detail with the patient.   A list for source of those nutrients were also provided to the patient.  Patient will use only water or unsweetened tea for  hydration. B.  The need to stay away from risky substances including alcohol, smoking; obtaining 7 to 9 hours of restorative sleep, at least 150 minutes of moderate intensity exercise weekly, the importance of healthy social connections,  and stress management techniques were discussed. C.  A full color page of  Calorie density of various food groups per pound showing examples of each food groups was provided to the patient.  - he has been  scheduled with Jearld Fenton, RDN, CDE for diabetes education.  - I have approached him with the following individualized plan to manage  his diabetes and patient agrees:   - Based on his presentation with near target glycemic profile with A1c of 6.3%, he will not need insulin treatment for now.  He is advised to continue metformin 500 mg p.o. daily at breakfast.     -He has several options if he loses control of diabetes during his next visit, including glipizide therapy, insulin, incretin therapy as appropriate.  - Specific targets for  A1c;  LDL, HDL, Triglycerides,  were discussed with the patient.  2) Blood Pressure /Hypertension:  His blood pressure is controlled to target.  He is not on any antihypertensive medications.  The above described whole food plant-based diet will help with hypertension, will be considered for antihypertensive medications if he returns with out engagement in lifestyle medicine.  3) Lipids/Hyperlipidemia:   Review of his recent lipid panel showed controlled LDL at 54.   Whole food plant-based diet will help with dyslipidemia.  He is advised to continue Zocor 40 mg p.o. nightly.  Side effects and precautions discussed with him.      4)  Weight/Diet:  Body mass index is 32.8 kg/m.  -   clearly complicating his diabetes care.   he is  a candidate for weight  loss. I discussed with him the fact that loss of 5 - 10% of his  current body weight will have the most impact on his diabetes management.  Exercise, and detailed  carbohydrates information provided  -  detailed on discharge instructions.  5) Chronic Care/Health Maintenance:  -he  is on Statin medications and  is encouraged to initiate and continue to follow up with Ophthalmology, Dentist,  Podiatrist at least yearly or according to recommendations, and advised to   stay away from smoking. I have recommended yearly flu vaccine and pneumonia vaccine at least every 5 years; moderate intensity exercise for up to 150 minutes weekly; and  sleep for at least 7 hours a day.  His screening ABI was negative for PAD in September 2021, his study will be repeated in December 2026, or sooner if needed.   - he is  advised to maintain close follow up with Rakes, Connye Burkitt, FNP for primary care needs, as well as his other providers for optimal and coordinated care.  I spent  26  minutes in the care of the patient today including review of labs from [place], Lipids, Thyroid Function, Hematology (current and previous including abstractions from other facilities); face-to-face time discussing  his blood glucose readings/logs, discussing hypoglycemia and hyperglycemia episodes and symptoms, medications doses, his options of short and long term treatment based on the latest standards of care / guidelines;  discussion about incorporating lifestyle medicine;  and documenting the encounter. Risk reduction counseling performed per USPSTF guidelines to reduce  obesity and cardiovascular risk factors.     Please refer to Patient Instructions for Blood Glucose Monitoring and Insulin/Medications Dosing Guide"  in media tab for additional information. Please  also refer to " Patient Self Inventory" in the Media  tab for reviewed elements of pertinent patient history.  Andres Robinson participated in the discussions, expressed understanding, and voiced agreement with the above plans.  All questions were answered to his satisfaction. he is encouraged to contact clinic should he have any questions or  concerns prior to his return visit.    Follow up plan: - Return in about 1 year (around 02/29/2024) for Fasting Labs  in AM B4 8, A1c -[place].  Glade Lloyd, MD [facility] [address] [place], [place] [address] Phone: [phone]  Fax: [phone]    03/01/2023, 3:29 PM  This note was partially dictated with voice recognition software. Similar sounding words can be transcribed inadequately or may not  be corrected upon review.

## 2023-03-02 ENCOUNTER — Other Ambulatory Visit: Payer: Self-pay | Admitting: Family Medicine

## 2023-03-02 ENCOUNTER — Ambulatory Visit (INDEPENDENT_AMBULATORY_CARE_PROVIDER_SITE_OTHER): Payer: Medicare HMO

## 2023-03-02 VITALS — Ht 71.0 in | Wt 236.0 lb

## 2023-03-02 DIAGNOSIS — E782 Mixed hyperlipidemia: Secondary | ICD-10-CM

## 2023-03-02 DIAGNOSIS — E1122 Type 2 diabetes mellitus with diabetic chronic kidney disease: Secondary | ICD-10-CM

## 2023-03-02 DIAGNOSIS — Z0000 Encounter for general adult medical examination without abnormal findings: Secondary | ICD-10-CM | POA: Diagnosis not present

## 2023-03-02 DIAGNOSIS — N401 Enlarged prostate with lower urinary tract symptoms: Secondary | ICD-10-CM

## 2023-03-02 NOTE — Progress Notes (Addendum)
Subjective:   Andres Robinson is a 67 y.o. male who presents for Medicare Annual/Subsequent preventive examination. I connected with  Andres Robinson on 03/02/23 by a audio enabled telemedicine application and verified that I am speaking with the correct person using two identifiers.  Patient Location: Home  Provider Location: Home Office  I discussed the limitations of evaluation and management by telemedicine. The patient expressed understanding and agreed to proceed.  Review of Systems     Cardiac Risk Factors include: advanced age (>65mn, >>47women);male gender;hypertension;dyslipidemia     Objective:    Today's Vitals   03/02/23 0823  Weight: 236 lb (107 kg)  Height: '5\' 11"'$  (1.803 m)   Body mass index is 32.92 kg/m.     03/02/2023    8:25 AM 10/12/2021    9:59 AM 05/02/2021    3:51 PM 01/14/2014   11:34 AM  Advanced Directives  Does Patient Have a Medical Advance Directive? Yes Yes Yes Patient does not have advance directive;Patient would not like information  Type of AScientist, forensicPower of AAni[place]Living will [place]Living will [place]Living will   Does patient want to make changes to medical advance directive?   No - Patient declined   Copy of HHollyin Chart? No - copy requested  No - copy requested     Current Medications (verified) Outpatient Encounter Medications as of 03/02/2023  Medication Sig   acetaminophen (TYLENOL) 650 MG CR tablet Take 1,300 mg by mouth every 8 (eight) hours as needed for pain.   diphenhydramine-acetaminophen (TYLENOL PM) 25-500 MG TABS tablet Take 1 tablet by mouth at bedtime as needed (sleep).   GLUCOSAMINE-CHONDROITIN PO Take 2 tablets by mouth daily as needed.   metFORMIN (GLUCOPHAGE) 500 MG tablet TAKE ONE TABLET ONCE DAILY WITH BREAKFAST   Omega-3 Fatty Acids (FISH OIL ULTRA) 1400 MG CAPS Take 1,400 mg by mouth 2 (two) times daily with a meal.   ONETOUCH  ULTRA test strip CHECK BLOOD SUGAR ONCE ANDAY   oxymetazoline (AFRIN) 0.05 % nasal spray Place 1 spray into both nostrils 2 (two) times daily as needed for congestion.   simvastatin (ZOCOR) 40 MG tablet TAKE ONE TABLET DAILY AT 6PM   tamsulosin (FLOMAX) 0.4 MG CAPS capsule TAKE ONE CAPSULE BY MOUTH TWICE DAILY   No facility-administered encounter medications on file as of 03/02/2023.    Allergies (verified) Penicillins   History: Past Medical History:  Diagnosis Date   DDD (degenerative disc disease), cervical    DDD (degenerative disc disease), lumbar    Essential hypertension, benign 08/31/2022   High cholesterol    Normal cardiac stress test 2005   [facility]   Type 2 diabetes mellitus with stage 2 chronic kidney disease, without long-term current use of insulin ([place] 09/16/2019   Past Surgical History:  Procedure Laterality Date   CATARACT EXTRACTION Bilateral    COLONOSCOPY     COLONOSCOPY WITH PROPOFOL N/A 10/12/2021   Procedure: COLONOSCOPY WITH PROPOFOL;  Surgeon: CHarvel Quale MD;  [facility];  Service: Gastroenterology;  Laterality: N/A;  10:45   ESOPHAGOGASTRODUODENOSCOPY N/A 01/14/2014   Procedure: ESOPHAGOGASTRODUODENOSCOPY (EGD);  Surgeon: NRogene Houston MD;  [facility];  Service: Endoscopy;  Laterality: N/A;  320   POLYPECTOMY  10/12/2021   Procedure: POLYPECTOMY INTESTINAL;  Surgeon: CHarvel Quale MD;  Location: [facility];  Service: Gastroenterology;;   VMichelene Gardener  Family History  Problem Relation Age of Onset   Early death Mother    Heart disease Father    Diabetes Father    Hyperlipidemia Father    Hypertension Father    AAA (abdominal aortic aneurysm) Father    Peripheral vascular disease Father        amputation   Heart Problems Father    Hypertension Brother    Heart attack Brother    Social History   Socioeconomic History   Marital status: Married    Spouse  name: Tena    Number of children: 0   Years of education: Not on file   Highest education level: Some college, no degree  Occupational History   Not on file  Tobacco Use   Smoking status: Former    Types: Cigarettes    Quit date: 1982    Years since quitting: 42.2   Smokeless tobacco: Never   Tobacco comments:    quit in 1982  Vaping Use   Vaping Use: Never used  Substance and Sexual Activity   Alcohol use: Not Currently    Alcohol/week: 7.0 standard drinks of alcohol    Types: 7 Standard drinks or equivalent per week   Drug use: No   Sexual activity: Not Currently    Partners: Female  Other Topics Concern   Not on file  Social History Narrative   Lives with Tena married since 1981      Pets: 1 boston terrier -deaf and one eye: Ladell Heads      Enjoys: hunt and being outside      Diet: eat all food groups   Caffeine: 4 cups of coffee   Water: 4 glasses a day      Wears seat belt    Smoke detectors    Does not use phone while driving   Social Determinants of Health   Financial Resource Strain: Low Risk  (03/02/2023)   Overall Financial Resource Strain (CARDIA)    Difficulty of Paying Living Expenses: Not hard at all  Food Insecurity: No Food Insecurity (03/02/2023)   Hunger Vital Sign    Worried About Running Out of Food in the Last Year: Never true    Ran Out of Food in the Last Year: Never true  Transportation Needs: No Transportation Needs (05/02/2021)   PRAPARE - Hydrologist (Medical): No    Lack of Transportation (Non-Medical): No  Physical Activity: Insufficiently Active (03/02/2023)   Exercise Vital Sign    Days of Exercise per Week: 3 days    Minutes of Exercise per Session: 30 min  Stress: No Stress Concern Present (03/02/2023)   [place]    Feeling of Stress : Not at all  Social Connections: Moderately Integrated (03/02/2023)   Social Connection and Isolation Panel  [NHANES]    Frequency of Communication with Friends and Family: More than three times a week    Frequency of Social Gatherings with Friends and Family: More than three times a week    Attends Religious Services: More than 4 times per year    Active Member of [employer] or Organizations: No    Attends Archivist Meetings: Never    Marital Status: Married    Tobacco Counseling Counseling given: Not Answered Tobacco comments: quit in 1982   Clinical Intake:  Pre-visit preparation completed: Yes  Pain : No/denies pain     Nutritional Risks: None Diabetes: Yes CBG done?:  No Did pt. bring in CBG monitor from home?: No  How often do you need to have someone help you when you read instructions, pamphlets, or other written materials from your doctor or pharmacy?: 1 - Never  Diabetic?yes Nutrition Risk Assessment:  Has the patient had any N/V/D within the last 2 months?  No  Does the patient have any non-healing wounds?  No  Has the patient had any unintentional weight loss or weight gain?  No   Diabetes:  Is the patient diabetic?  Yes  If diabetic, was a CBG obtained today?  No  Did the patient bring in their glucometer from home?  No  How often do you monitor your CBG's? Never .   Financial Strains and Diabetes Management:  Are you having any financial strains with the device, your supplies or your medication? No .  Does the patient want to be seen by Chronic Care Management for management of their diabetes?  No  Would the patient like to be referred to a Nutritionist or for Diabetic Management?  No   Diabetic Exams:  Diabetic Eye Exam: Completed 12/2022 Diabetic Foot Exam: Overdue, Pt has been advised about the importance in completing this exam. Pt is scheduled for diabetic foot exam on next office visit .   Interpreter Needed?: No  Information entered by :: Jadene Pierini, LPN   Activities of Daily Living    03/02/2023    8:25 AM  In your present state of  health, do you have any difficulty performing the following activities:  Hearing? 0  Vision? 0  Difficulty concentrating or making decisions? 0  Walking or climbing stairs? 0  Dressing or bathing? 0  Doing errands, shopping? 0  Preparing Food and eating ? N  Using the Toilet? N  In the past six months, have you accidently leaked urine? N  Do you have problems with loss of bowel control? N  Managing your Medications? N  Managing your Finances? N  Housekeeping or managing your Housekeeping? N    Patient Care Team: Baruch Gouty, FNP as PCP - General (Family Medicine) Cassandria Anger, MD as Consulting Physician (Endocrinology)  Indicate any recent Medical Services you may have received from other than [facility] providers in the past year (date may be approximate).     Assessment:   This is a routine wellness examination for [place].  Hearing/Vision screen Vision Screening - Comments:: Wears rx glasses - up to date with routine eye exams with  Dr.Davis   Dietary issues and exercise activities discussed: Current Exercise Habits: Home exercise routine, Type of exercise: walking, Time (Minutes): 30, Frequency (Times/Week): 3, Weekly Exercise (Minutes/Week): 90, Intensity: Mild, Exercise limited by: None identified   Goals Addressed             This Visit's Progress    Patient Stated   On track    I would like to maintain my current health       Depression Screen    03/02/2023    8:25 AM 09/05/2022    8:52 AM 08/02/2022    1:08 PM 12/06/2021    1:23 PM 08/09/2021    1:49 PM 06/29/2021    1:56 PM 05/02/2021    3:53 PM  PHQ 2/9 Scores  PHQ - 2 Score 0 0 0 0 0 0 0  PHQ- 9 Score  0 0 0       Fall Risk    03/02/2023    8:24 AM 08/02/2022  1:08 PM 12/06/2021    1:23 PM 08/09/2021    1:49 PM 06/29/2021    1:56 PM  [place] in the past year? 0 0 0 0 0  Number falls in past yr: 0   0 0  Injury with Fall? 0   0 0  Risk for fall due to : No Fall Risks   No Fall Risks No  Fall Risks  Follow up Falls prevention discussed   Falls evaluation completed Falls evaluation completed    [place]:  Any stairs in or around the home? No  If so, are there any without handrails? No  Home free of loose throw rugs in walkways, pet beds, electrical cords, etc? Yes  Adequate lighting in your home to reduce risk of falls? Yes   ASSISTIVE DEVICES UTILIZED TO PREVENT FALLS:  Life alert? No  Use of a cane, walker or w/c? No  Grab bars in the bathroom? No  Shower chair or bench in shower? No  Elevated toilet seat or a handicapped toilet? No      03/02/2023    8:26 AM  6CIT Screen  What Year? 0 points  What month? 0 points  What time? 0 points  Count back from 20 0 points  Months in reverse 0 points  Repeat phrase 0 points  Total Score 0 points    Immunizations Immunization History  Administered Date(s) Administered   Fluad Quad(high Dose 65+) 09/29/2020   Moderna Sars-Covid-2 Vaccination 02/19/2020, 03/19/2020, 01/18/2021   Tdap 10/07/2018    TDAP status: Up to date  Flu Vaccine status: Declined, Education has been provided regarding the importance of this vaccine but patient still declined. Advised may receive this vaccine at local pharmacy or Health Dept. Aware to provide a copy of the vaccination record if obtained from local pharmacy or Health Dept. Verbalized acceptance and understanding.  Pneumococcal vaccine status: Declined,  Education has been provided regarding the importance of this vaccine but patient still declined. Advised may receive this vaccine at local pharmacy or Health Dept. Aware to provide a copy of the vaccination record if obtained from local pharmacy or Health Dept. Verbalized acceptance and understanding.   Covid-19 vaccine status: Completed vaccines  Qualifies for Shingles Vaccine? Yes   Zostavax completed No   Shingrix Completed?: No.    Education has been provided regarding the importance of this  vaccine. Patient has been advised to call insurance company to determine out of pocket expense if they have not yet received this vaccine. Advised may also receive vaccine at local pharmacy or Health Dept. Verbalized acceptance and understanding.  Screening Tests Health Maintenance  Topic Date Due   Zoster Vaccines- Shingrix (1 of 2) Never done   Pneumonia Vaccine 5+ Years old (1 of 1 - PCV) Never done   COVID-19 Vaccine (4 - 2023-24 season) 08/25/2022   INFLUENZA VACCINE  03/25/2023 (Originally 07/25/2022)   Diabetic kidney evaluation - eGFR measurement  08/03/2023   Diabetic kidney evaluation - Urine ACR  08/03/2023   HEMOGLOBIN A1C  09/01/2023   OPHTHALMOLOGY EXAM  12/29/2023   Medicare Annual Wellness (AWV)  03/01/2024   COLONOSCOPY (Pts 45-34yr Insurance coverage will need to be confirmed)  10/12/2024   DTaP/Tdap/Td (2 - Td or Tdap) 10/07/2028   Hepatitis C Screening  Completed   HPV VACCINES  Aged Out   FOOT EXAM  Discontinued    Health Maintenance  Health Maintenance Due  Topic Date Due  Zoster Vaccines- Shingrix (1 of 2) Never done   Pneumonia Vaccine 41+ Years old (1 of 1 - PCV) Never done   COVID-19 Vaccine (4 - 2023-24 season) 08/25/2022    Colorectal cancer screening: Type of screening: Colonoscopy. Completed 10/12/2021. Repeat every 3 years  Lung Cancer Screening: (Low Dose CT Chest recommended if Age 4-80 years, 30 pack-year currently smoking OR have quit w/in 15years.) does not qualify.   Lung Cancer Screening Referral: n/a  Additional Screening:  Hepatitis C Screening: does not qualify; Completed 03/03/2020  Vision Screening: Recommended annual ophthalmology exams for early detection of glaucoma and other disorders of the eye. Is the patient up to date with their annual eye exam?  Yes  Who is the provider or what is the name of the office in which the patient attends annual eye exams? Dr.Davis  If pt is not established with a provider, would they like to  be referred to a provider to establish care? No .   Dental Screening: Recommended annual dental exams for proper oral hygiene  Community Resource Referral / Chronic Care Management: CRR required this visit?  No   CCM required this visit?  No      Plan:     I have personally reviewed and noted the following in the patient's chart:   Medical and social history Use of alcohol, tobacco or illicit drugs  Current medications and supplements including opioid prescriptions. Patient is not currently taking opioid prescriptions. Functional ability and status Nutritional status Physical activity Advanced directives List of other physicians Hospitalizations, surgeries, and ER visits in previous 12 months Vitals Screenings to include cognitive, depression, and falls Referrals and appointments  In addition, I have reviewed and discussed with patient certain preventive protocols, quality metrics, and best practice recommendations. A written personalized care plan for preventive services as well as general preventive health recommendations were provided to patient.     Daphane Shepherd, LPN   [id number]   Nurse Notes: Due pneumonia Vaccine

## 2023-03-02 NOTE — Patient Instructions (Signed)
Mr. Andres Robinson , Thank you for taking time to come for your Medicare Wellness Visit. I appreciate your ongoing commitment to your health goals. Please review the following plan we discussed and let me know if I can assist you in the future.   These are the goals we discussed:  Goals      Patient Stated     I would like to maintain my current health        This is a list of the screening recommended for you and due dates:  Health Maintenance  Topic Date Due   Zoster (Shingles) Vaccine (1 of 2) Never done   Pneumonia Vaccine (1 of 1 - PCV) Never done   COVID-19 Vaccine (4 - 2023-24 season) 08/25/2022   Flu Shot  03/25/2023*   Yearly kidney function blood test for diabetes  08/03/2023   Yearly kidney health urinalysis for diabetes  08/03/2023   Hemoglobin A1C  09/01/2023   Eye exam for diabetics  12/29/2023   Medicare Annual Wellness Visit  03/01/2024   Colon Cancer Screening  10/12/2024   DTaP/Tdap/Td vaccine (2 - Td or Tdap) 10/07/2028   Hepatitis C Screening: USPSTF Recommendation to screen - Ages 60-79 yo.  Completed   HPV Vaccine  Aged Out   Complete foot exam   Discontinued  *Topic was postponed. The date shown is not the original due date.    Advanced directives: Please bring a copy of your health care power of attorney and living will to the office to be added to your chart at your convenience.   Conditions/risks identified: Aim for 30 minutes of exercise or brisk walking, 6-8 glasses of water, and 5 servings of fruits and vegetables each day.   Next appointment: Follow up in one year for your annual wellness visit.   Preventive Care 81 Years and Older, Male  Preventive care refers to lifestyle choices and visits with your health care provider that can promote health and wellness. What does preventive care include? A yearly physical exam. This is also called an annual well check. Dental exams once or twice a year. Routine eye exams. Ask your health care provider how often  you should have your eyes checked. Personal lifestyle choices, including: Daily care of your teeth and gums. Regular physical activity. Eating a healthy diet. Avoiding tobacco and drug use. Limiting alcohol use. Practicing safe sex. Taking low doses of aspirin every day. Taking vitamin and mineral supplements as recommended by your health care provider. What happens during an annual well check? The services and screenings done by your health care provider during your annual well check will depend on your age, overall health, lifestyle risk factors, and family history of disease. Counseling  Your health care provider may ask you questions about your: Alcohol use. Tobacco use. Drug use. Emotional well-being. Home and relationship well-being. Sexual activity. Eating habits. History of falls. Memory and ability to understand (cognition). Work and work Statistician. Screening  You may have the following tests or measurements: Height, weight, and BMI. Blood pressure. Lipid and cholesterol levels. These may be checked every 5 years, or more frequently if you are over 26 years old. Skin check. Lung cancer screening. You may have this screening every year starting at age 33 if you have a 30-pack-year history of smoking and currently smoke or have quit within the past 15 years. Fecal occult blood test (FOBT) of the stool. You may have this test every year starting at age 4. Flexible sigmoidoscopy or colonoscopy.  You may have a sigmoidoscopy every 5 years or a colonoscopy every 10 years starting at age 75. Prostate cancer screening. Recommendations will vary depending on your family history and other risks. Hepatitis C blood test. Hepatitis B blood test. Sexually transmitted disease (STD) testing. Diabetes screening. This is done by checking your blood sugar (glucose) after you have not eaten for a while (fasting). You may have this done every 1-3 years. Abdominal aortic aneurysm (AAA)  screening. You may need this if you are a current or former smoker. Osteoporosis. You may be screened starting at age 56 if you are at high risk. Talk with your health care provider about your test results, treatment options, and if necessary, the need for more tests. Vaccines  Your health care provider may recommend certain vaccines, such as: Influenza vaccine. This is recommended every year. Tetanus, diphtheria, and acellular pertussis (Tdap, Td) vaccine. You may need a Td booster every 10 years. Zoster vaccine. You may need this after age 68. Pneumococcal 13-valent conjugate (PCV13) vaccine. One dose is recommended after age 25. Pneumococcal polysaccharide (PPSV23) vaccine. One dose is recommended after age 15. Talk to your health care provider about which screenings and vaccines you need and how often you need them. This information is not intended to replace advice given to you by your health care provider. Make sure you discuss any questions you have with your health care provider. Document Released: 01/07/2016 Document Revised: 08/30/2016 Document Reviewed: 10/12/2015 Elsevier Interactive Patient Education  2017 [place] Prevention in the Home Falls can cause injuries. They can happen to people of all ages. There are many things you can do to make your home safe and to help prevent falls. What can I do on the outside of my home? Regularly fix the edges of walkways and driveways and fix any cracks. Remove anything that might make you trip as you walk through a door, such as a raised step or threshold. Trim any bushes or trees on the path to your home. Use bright outdoor lighting. Clear any walking paths of anything that might make someone trip, such as rocks or tools. Regularly check to see if handrails are loose or broken. Make sure that both sides of any steps have handrails. Any raised decks and porches should have guardrails on the edges. Have any leaves, snow, or ice  cleared regularly. Use sand or salt on walking paths during winter. Clean up any spills in your garage right away. This includes oil or grease spills. What can I do in the bathroom? Use night lights. Install grab bars by the toilet and in the tub and shower. Do not use towel bars as grab bars. Use non-skid mats or decals in the tub or shower. If you need to sit down in the shower, use a plastic, non-slip stool. Keep the floor dry. Clean up any water that spills on the floor as soon as it happens. Remove soap buildup in the tub or shower regularly. Attach bath mats securely with double-sided non-slip rug tape. Do not have throw rugs and other things on the floor that can make you trip. What can I do in the bedroom? Use night lights. Make sure that you have a light by your bed that is easy to reach. Do not use any sheets or blankets that are too big for your bed. They should not hang down onto the floor. Have a firm chair that has side arms. You can use this for support while you  get dressed. Do not have throw rugs and other things on the floor that can make you trip. What can I do in the kitchen? Clean up any spills right away. Avoid walking on wet floors. Keep items that you use a lot in easy-to-reach places. If you need to reach something above you, use a strong step stool that has a grab bar. Keep electrical cords out of the way. Do not use floor polish or wax that makes floors slippery. If you must use wax, use non-skid floor wax. Do not have throw rugs and other things on the floor that can make you trip. What can I do with my stairs? Do not leave any items on the stairs. Make sure that there are handrails on both sides of the stairs and use them. Fix handrails that are broken or loose. Make sure that handrails are as long as the stairways. Check any carpeting to make sure that it is firmly attached to the stairs. Fix any carpet that is loose or worn. Avoid having throw rugs at the  top or bottom of the stairs. If you do have throw rugs, attach them to the floor with carpet tape. Make sure that you have a light switch at the top of the stairs and the bottom of the stairs. If you do not have them, ask someone to add them for you. What else can I do to help prevent falls? Wear shoes that: Do not have high heels. Have rubber bottoms. Are comfortable and fit you well. Are closed at the toe. Do not wear sandals. If you use a stepladder: Make sure that it is fully opened. Do not climb a closed stepladder. Make sure that both sides of the stepladder are locked into place. Ask someone to hold it for you, if possible. Clearly mark and make sure that you can see: Any grab bars or handrails. First and last steps. Where the edge of each step is. Use tools that help you move around (mobility aids) if they are needed. These include: Canes. Walkers. Scooters. Crutches. Turn on the lights when you go into a dark area. Replace any light bulbs as soon as they burn out. Set up your furniture so you have a clear path. Avoid moving your furniture around. If any of your floors are uneven, fix them. If there are any pets around you, be aware of where they are. Review your medicines with your doctor. Some medicines can make you feel dizzy. This can increase your chance of falling. Ask your doctor what other things that you can do to help prevent falls. This information is not intended to replace advice given to you by your health care provider. Make sure you discuss any questions you have with your health care provider. Document Released: 10/07/2009 Document Revised: 05/18/2016 Document Reviewed: 01/15/2015 Elsevier Interactive Patient Education  2017 [employer].

## 2023-03-06 ENCOUNTER — Encounter: Payer: Self-pay | Admitting: Family Medicine

## 2023-03-06 ENCOUNTER — Ambulatory Visit (INDEPENDENT_AMBULATORY_CARE_PROVIDER_SITE_OTHER): Payer: Medicare HMO | Admitting: Family Medicine

## 2023-03-06 VITALS — BP 128/78 | HR 81 | Temp 98.0°F | Ht 71.0 in | Wt 236.0 lb

## 2023-03-06 DIAGNOSIS — R351 Nocturia: Secondary | ICD-10-CM

## 2023-03-06 DIAGNOSIS — N182 Chronic kidney disease, stage 2 (mild): Secondary | ICD-10-CM

## 2023-03-06 DIAGNOSIS — E1159 Type 2 diabetes mellitus with other circulatory complications: Secondary | ICD-10-CM

## 2023-03-06 DIAGNOSIS — E1169 Type 2 diabetes mellitus with other specified complication: Secondary | ICD-10-CM

## 2023-03-06 DIAGNOSIS — Z79899 Other long term (current) drug therapy: Secondary | ICD-10-CM | POA: Diagnosis not present

## 2023-03-06 DIAGNOSIS — E785 Hyperlipidemia, unspecified: Secondary | ICD-10-CM

## 2023-03-06 DIAGNOSIS — Z125 Encounter for screening for malignant neoplasm of prostate: Secondary | ICD-10-CM

## 2023-03-06 DIAGNOSIS — E1122 Type 2 diabetes mellitus with diabetic chronic kidney disease: Secondary | ICD-10-CM | POA: Diagnosis not present

## 2023-03-06 DIAGNOSIS — I152 Hypertension secondary to endocrine disorders: Secondary | ICD-10-CM | POA: Insufficient documentation

## 2023-03-06 DIAGNOSIS — N401 Enlarged prostate with lower urinary tract symptoms: Secondary | ICD-10-CM

## 2023-03-06 MED ORDER — TAMSULOSIN HCL 0.4 MG PO CAPS: 0.4000 mg | ORAL_CAPSULE | Freq: Two times a day (BID) | ORAL | 3 refills | Status: DC

## 2023-03-06 MED ORDER — SIMVASTATIN 40 MG PO TABS: 40.0000 mg | ORAL_TABLET | Freq: Every day | ORAL | 3 refills | Status: DC

## 2023-03-06 NOTE — Progress Notes (Signed)
Subjective:  Patient ID: Andres Robinson, male    DOB: 08/10/1956, 67 y.o.   MRN: [id number]  Patient Care Team: Baruch Gouty, FNP as PCP - General (Family Medicine) Cassandria Anger, MD as Consulting Physician (Endocrinology)   Chief Complaint:  Medical Management of Chronic Issues (6 month follow up )   HPI: Andres Robinson is a 67 y.o. male presenting on 03/06/2023 for Medical Management of Chronic Issues (6 month follow up )    1. Type 2 diabetes mellitus with other specified complication, without long-term current use of insulin (HCC) Currently on metformin and tolerating well. Does see endocrinology with recent A1C 6.3. Not on ACEi or ARB. Willing to start for renal protection once labs result. Denies polyuria, polyphagia, or polydipsia.  2. Hyperlipidemia associated with type 2 diabetes mellitus ([place]) On statin therapy and tolerating well. Minimal to no myalgias. Does try to follow a healthy diet. Very active on a daily basis.   3. CKD stage 2 due to type 2 diabetes mellitus ([place]) Not on ACEi or ARB and willing to start. Denies changes in urinary output. No edema, confusion, weakness, or fatigue.   4. Hypertension associated with diabetes ([place]) Diet controlled. Not on medications. Limits sodium intake and stays active daily. No headaches, chest pain, leg swelling, weakness, confusion, visual changes, or syncope.   5. Benign prostatic hyperplasia with nocturia Reports doing well on Flomax. Does still have 1-2 episodes of nocturia nightly. No postvoid dribbling, weak stream, scrotal pain or swelling, or rectal pressure.      Relevant past medical, surgical, family, and social history reviewed and updated as indicated.  Allergies and medications reviewed and updated. Data reviewed: Chart in Epic.   Past Medical History:  Diagnosis Date   DDD (degenerative disc disease), cervical    DDD (degenerative disc disease), lumbar    Essential hypertension, benign 08/31/2022    High cholesterol    Normal cardiac stress test 2005   [facility] spotted fever) 2013   Type 2 diabetes mellitus with stage 2 chronic kidney disease, without long-term current use of insulin ([place]) 09/16/2019    Past Surgical History:  Procedure Laterality Date   CATARACT EXTRACTION Bilateral    COLONOSCOPY     COLONOSCOPY WITH PROPOFOL N/A 10/12/2021   Procedure: COLONOSCOPY WITH PROPOFOL;  Surgeon: Harvel Quale, MD;  Location: [facility];  Service: Gastroenterology;  Laterality: N/A;  10:45   ESOPHAGOGASTRODUODENOSCOPY N/A 01/14/2014   Procedure: ESOPHAGOGASTRODUODENOSCOPY (EGD);  Surgeon: Rogene Houston, MD;  Location: [facility];  Service: Endoscopy;  Laterality: N/A;  320   POLYPECTOMY  10/12/2021   Procedure: POLYPECTOMY INTESTINAL;  Surgeon: Harvel Quale, MD;  Location: [facility];  Service: Gastroenterology;;   VASECTOMY      Social History   Socioeconomic History   Marital status: Married    Spouse name: Tena    Number of children: 0   Years of education: Not on file   Highest education level: Some college, no degree  Occupational History   Not on file  Tobacco Use   Smoking status: Former    Types: Cigarettes    Quit date: 1982    Years since quitting: 42.2   Smokeless tobacco: Never   Tobacco comments:    quit in 1982  Vaping Use   Vaping Use: Never used  Substance and Sexual Activity   Alcohol use: Not Currently    Alcohol/week: 7.0 standard drinks of alcohol  Types: 7 Standard drinks or equivalent per week   Drug use: No   Sexual activity: Not Currently    Partners: Female  Other Topics Concern   Not on file  Social History Narrative   Lives with Tena married since 1981      Pets: 1 boston terrier -deaf and one eye: Ladell Heads      Enjoys: hunt and being outside      Diet: eat all food groups   Caffeine: 4 cups of coffee   Water: 4 glasses a day      Wears seat belt    Smoke detectors    Does not  use phone while driving   Social Determinants of Health   Financial Resource Strain: Low Risk  (03/02/2023)   Overall Financial Resource Strain (CARDIA)    Difficulty of Paying Living Expenses: Not hard at all  Food Insecurity: No Food Insecurity (03/02/2023)   Hunger Vital Sign    Worried About Running Out of Food in the Last Year: Never true    Strong in the Last Year: Never true  Transportation Needs: No Transportation Needs (05/02/2021)   PRAPARE - Hydrologist (Medical): No    Lack of Transportation (Non-Medical): No  Physical Activity: Insufficiently Active (03/02/2023)   Exercise Vital Sign    Days of Exercise per Week: 3 days    Minutes of Exercise per Session: 30 min  Stress: No Stress Concern Present (03/02/2023)   [place]    Feeling of Stress : Not at all  Social Connections: Moderately Integrated (03/02/2023)   Social Connection and Isolation Panel [NHANES]    Frequency of Communication with Friends and Family: More than three times a week    Frequency of Social Gatherings with Friends and Family: More than three times a week    Attends Religious Services: More than 4 times per year    Active Member of [employer] or Organizations: No    Attends Archivist Meetings: Never    Marital Status: Married  Human resources officer Violence: Not At Risk (03/02/2023)   Humiliation, Afraid, Rape, and Kick questionnaire    Fear of Current or Ex-Partner: No    Emotionally Abused: No    Physically Abused: No    Sexually Abused: No    Outpatient Encounter Medications as of 03/06/2023  Medication Sig   acetaminophen (TYLENOL) 650 MG CR tablet Take 1,300 mg by mouth every 8 (eight) hours as needed for pain.   diphenhydramine-acetaminophen (TYLENOL PM) 25-500 MG TABS tablet Take 1 tablet by mouth at bedtime as needed (sleep).   GLUCOSAMINE-CHONDROITIN PO Take 2 tablets by mouth daily as needed.    metFORMIN (GLUCOPHAGE) 500 MG tablet TAKE ONE TABLET ONCE DAILY WITH BREAKFAST   Omega-3 Fatty Acids (FISH OIL ULTRA) 1400 MG CAPS Take 1,400 mg by mouth 2 (two) times daily with a meal.   ONETOUCH ULTRA test strip CHECK BLOOD SUGAR ONCE ANDAY   oxymetazoline (AFRIN) 0.05 % nasal spray Place 1 spray into both nostrils 2 (two) times daily as needed for congestion.   simvastatin (ZOCOR) 40 MG tablet Take 1 tablet (40 mg total) by mouth daily at 6 PM.   tamsulosin (FLOMAX) 0.4 MG CAPS capsule Take 1 capsule (0.4 mg total) by mouth 2 (two) times daily.   [DISCONTINUED] simvastatin (ZOCOR) 40 MG tablet TAKE ONE TABLET DAILY AT 6PM   [DISCONTINUED] tamsulosin (FLOMAX) 0.4 MG  CAPS capsule TAKE ONE CAPSULE BY MOUTH TWICE DAILY   No facility-administered encounter medications on file as of 03/06/2023.    Allergies  Allergen Reactions   Penicillins Other (See Comments)    Childhood Reaction     Review of Systems  Constitutional:  Negative for activity change, appetite change, chills, diaphoresis, fatigue, fever and unexpected weight change.  HENT: Negative.    Eyes: Negative.  Negative for photophobia and visual disturbance.  Respiratory:  Negative for cough, chest tightness and shortness of breath.   Cardiovascular:  Negative for chest pain, palpitations and leg swelling.  Gastrointestinal:  Negative for abdominal pain, blood in stool, constipation, diarrhea, nausea, rectal pain and vomiting.  Endocrine: Negative.  Negative for polydipsia, polyphagia and polyuria.  Genitourinary:  Negative for decreased urine volume, difficulty urinating, dysuria, frequency, hematuria, penile discharge, penile pain, penile swelling, scrotal swelling, testicular pain and urgency.       Nocturia, not worsening  Musculoskeletal:  Negative for arthralgias and myalgias.  Skin: Negative.   Allergic/Immunologic: Negative.   Neurological:  Negative for dizziness, tremors, seizures, syncope, facial asymmetry, speech  difficulty, weakness, light-headedness, numbness and headaches.  Hematological: Negative.   Psychiatric/Behavioral:  Negative for confusion, hallucinations, sleep disturbance and suicidal ideas.   All other systems reviewed and are negative.       Objective:  BP 128/78   Pulse 81   Temp 98 F (36.7 C) (Temporal)   Ht '5\' 11"'$  (1.803 m)   Wt 236 lb (107 kg)   SpO2 95%   BMI 32.92 kg/m    Wt Readings from Last 3 Encounters:  03/06/23 236 lb (107 kg)  03/02/23 236 lb (107 kg)  03/01/23 235 lb 3.2 oz (106.7 kg)    Physical Exam Vitals and nursing note reviewed.  Constitutional:      General: He is not in acute distress.    Appearance: Normal appearance. He is well-developed and well-groomed. He is obese. He is not ill-appearing, toxic-appearing or diaphoretic.  HENT:     Head: Normocephalic and atraumatic.     Jaw: There is normal jaw occlusion.     Right Ear: Hearing normal.     Left Ear: Hearing normal.     Nose: Nose normal.     Mouth/Throat:     Lips: Pink.     Mouth: Mucous membranes are moist.     Pharynx: Uvula midline.  Eyes:     General: Lids are normal.     Conjunctiva/sclera: Conjunctivae normal.     Pupils: Pupils are equal, round, and reactive to light.  Neck:     Thyroid: No thyroid mass, thyromegaly or thyroid tenderness.     Vascular: No carotid bruit or JVD.     Trachea: Trachea and phonation normal.  Cardiovascular:     Rate and Rhythm: Normal rate and regular rhythm.     Chest Wall: PMI is not displaced.     Pulses: Normal pulses.     Heart sounds: Normal heart sounds. No murmur heard.    No friction rub. No gallop.  Pulmonary:     Effort: Pulmonary effort is normal. No respiratory distress.     Breath sounds: Normal breath sounds. No wheezing.  Abdominal:     General: There is no abdominal bruit.     Palpations: There is no hepatomegaly or splenomegaly.  Musculoskeletal:        General: Normal range of motion.     Cervical back: Normal range  of motion and neck  supple.     Right lower leg: No edema.     Left lower leg: No edema.  Lymphadenopathy:     Cervical: No cervical adenopathy.  Skin:    General: Skin is warm and dry.     Capillary Refill: Capillary refill takes less than 2 seconds.     Coloration: Skin is not cyanotic, jaundiced or pale.     Findings: No rash.  Neurological:     General: No focal deficit present.     Mental Status: He is alert and oriented to person, place, and time.     Sensory: Sensation is intact.     Motor: Motor function is intact.     Coordination: Coordination is intact.     Gait: Gait is intact.     Deep Tendon Reflexes: Reflexes are normal and symmetric.  Psychiatric:        Attention and Perception: Attention and perception normal.        Mood and Affect: Mood and affect normal.        Speech: Speech normal.        Behavior: Behavior normal. Behavior is cooperative.        Thought Content: Thought content normal.        Cognition and Memory: Cognition and memory normal.        Judgment: Judgment normal.     Results for orders placed or performed in visit on 03/01/23  HgB A1c  Result Value Ref Range   Hemoglobin A1C     HbA1c POC (<> result, manual entry)     HbA1c, POC (prediabetic range)     HbA1c, POC (controlled diabetic range) 6.3 0.0 - 7.0 %       Pertinent labs & imaging results that were available during my care of the patient were reviewed by me and considered in my medical decision making.  Assessment & Plan:  Ishawn was seen today for medical management of chronic issues.  Diagnoses and all orders for this visit:  Type 2 diabetes mellitus with other specified complication, without long-term current use of insulin ([place]) Followed by endocrinology on a regular basis. Diabetes is very well controlled on current regimen. Will update below labs today. Diet and exercise encouraged.  -     CBC with Differential/Platelet -     CMP14+EGFR -     Thyroid Panel With TSH -      Vitamin B12 -     simvastatin (ZOCOR) 40 MG tablet; Take 1 tablet (40 mg total) by mouth daily at 6 PM.  Hyperlipidemia associated with type 2 diabetes mellitus ([place]) Last lipid panel at goal. Continue statin therapy along with diet and exercise.  -     CMP14+EGFR -     simvastatin (ZOCOR) 40 MG tablet; Take 1 tablet (40 mg total) by mouth daily at 6 PM.  CKD stage 2 due to type 2 diabetes mellitus ([place]) Not on ACEi or ARB. Will check renal function today and start ACEi or ARB once labs result.  -     CBC with Differential/Platelet -     CMP14+EGFR  Hypertension associated with diabetes ([place]) BP well controlled. Not on medications. Will start ACEi or ARB once labs result. Goal BP is 130/80. Pt aware to report any persistent high or low readings. DASH diet and exercise encouraged. Exercise at least 150 minutes per week and increase as tolerated. Goal BMI > 25. Stress management encouraged. Avoid nicotine and tobacco product use. Avoid excessive alcohol  and NSAID's. Avoid more than 2000 mg of sodium daily. Medications as prescribed. Follow up as scheduled.  -     CBC with Differential/Platelet -     CMP14+EGFR -     Thyroid Panel With TSH  High risk medication use On metformin, will check Vit B12 today.  -     Vitamin B12  Benign prostatic hyperplasia with nocturia Doing well on below. Will continue. Will update PSA today.  -     PSA, total and free -     tamsulosin (FLOMAX) 0.4 MG CAPS capsule; Take 1 capsule (0.4 mg total) by mouth 2 (two) times daily.  Screening for prostate cancer -     PSA, total and free     Continue all other maintenance medications.  Follow up plan: Return in about 6 months (around 09/06/2023), or if symptoms worsen or fail to improve, for fasting labs, chronic follow up.   Continue healthy lifestyle choices, including diet (rich in fruits, vegetables, and lean proteins, and low in salt and simple carbohydrates) and exercise (at least 30 minutes of  moderate physical activity daily).  Educational handout given for DM  The above assessment and management plan was discussed with the patient. The patient verbalized understanding of and has agreed to the management plan. Patient is aware to call the clinic if they develop any new symptoms or if symptoms persist or worsen. Patient is aware when to return to the clinic for a follow-up visit. Patient educated on when it is appropriate to go to the emergency department.   Monia Pouch, FNP-C [facility] [phone]

## 2023-03-06 NOTE — Patient Instructions (Signed)

## 2023-03-07 LAB — CMP14+EGFR
ALT: 23 IU/L (ref 0–44)
AST: 21 IU/L (ref 0–40)
Albumin/Globulin Ratio: 1.8 (ref 1.2–2.2)
Albumin: 4.6 g/dL (ref 3.9–4.9)
Alkaline Phosphatase: 70 IU/L (ref 44–121)
BUN/Creatinine Ratio: 17 (ref 10–24)
BUN: 20 mg/dL (ref 8–27)
Bilirubin Total: 0.7 mg/dL (ref 0.0–1.2)
CO2: 21 mmol/L (ref 20–29)
Calcium: 9.7 mg/dL (ref 8.6–10.2)
Chloride: 104 mmol/L (ref 96–106)
Creatinine, Ser: 1.17 mg/dL (ref 0.76–1.27)
Globulin, Total: 2.5 g/dL (ref 1.5–4.5)
Glucose: 154 mg/dL — ABNORMAL HIGH (ref 70–99)
Potassium: 4.9 mmol/L (ref 3.5–5.2)
Sodium: 141 mmol/L (ref 134–144)
Total Protein: 7.1 g/dL (ref 6.0–8.5)
eGFR: 67 mL/min/{1.73_m2} (ref >59)

## 2023-03-07 LAB — CBC WITH DIFFERENTIAL/PLATELET
Basophils Absolute: 0.1 10*3/uL (ref 0.0–0.2)
Basos: 2 %
EOS (ABSOLUTE): 0.4 10*3/uL (ref 0.0–0.4)
Eos: 8 %
Hematocrit: 45.8 % (ref 37.5–51.0)
Hemoglobin: 15.2 g/dL (ref 13.0–17.7)
Immature Grans (Abs): 0 10*3/uL (ref 0.0–0.1)
Immature Granulocytes: 0 %
Lymphocytes Absolute: 1.2 10*3/uL (ref 0.7–3.1)
Lymphs: 23 %
MCH: 31 pg (ref 26.6–33.0)
MCHC: 33.2 g/dL (ref 31.5–35.7)
MCV: 93 fL (ref 79–97)
Monocytes Absolute: 0.5 10*3/uL (ref 0.1–0.9)
Monocytes: 9 %
Neutrophils Absolute: 3 10*3/uL (ref 1.4–7.0)
Neutrophils: 58 %
Platelets: 232 10*3/uL (ref 150–450)
RBC: 4.91 x10E6/uL (ref 4.14–5.80)
RDW: 12.8 % (ref 11.6–15.4)
WBC: 5.1 10*3/uL (ref 3.4–10.8)

## 2023-03-07 LAB — PSA, TOTAL AND FREE
PSA, Free Pct: 19.2 %
PSA, Free: 0.25 ng/mL
Prostate Specific Ag, Serum: 1.3 ng/mL (ref 0.0–4.0)

## 2023-03-07 LAB — THYROID PANEL WITH TSH
Free Thyroxine Index: 1.9 (ref 1.2–4.9)
T3 Uptake Ratio: 27 % (ref 24–39)
T4, Total: 7 ug/dL (ref 4.5–12.0)
TSH: 1.88 u[IU]/mL (ref 0.450–4.500)

## 2023-03-07 LAB — VITAMIN B12: Vitamin B-12: 401 pg/mL (ref 232–1245)

## 2023-03-07 MED ORDER — LISINOPRIL 2.5 MG PO TABS: 2.5000 mg | ORAL_TABLET | Freq: Every day | ORAL | 3 refills | Status: DC

## 2023-03-07 NOTE — Addendum Note (Signed)
Addended by: Baruch Gouty on: 03/07/2023 12:37 PM   Modules accepted: Orders

## 2023-05-28 ENCOUNTER — Other Ambulatory Visit: Payer: Self-pay | Admitting: "Endocrinology

## 2023-08-21 ENCOUNTER — Other Ambulatory Visit: Payer: Self-pay | Admitting: "Endocrinology

## 2023-09-05 ENCOUNTER — Ambulatory Visit: Payer: Medicare HMO

## 2023-09-11 ENCOUNTER — Encounter: Payer: Self-pay | Admitting: Family Medicine

## 2023-09-11 ENCOUNTER — Ambulatory Visit (INDEPENDENT_AMBULATORY_CARE_PROVIDER_SITE_OTHER): Payer: Medicare HMO | Admitting: Family Medicine

## 2023-09-11 VITALS — BP 143/83 | HR 71 | Temp 97.7°F | Ht 71.0 in | Wt 237.8 lb

## 2023-09-11 DIAGNOSIS — E785 Hyperlipidemia, unspecified: Secondary | ICD-10-CM

## 2023-09-11 DIAGNOSIS — I152 Hypertension secondary to endocrine disorders: Secondary | ICD-10-CM

## 2023-09-11 DIAGNOSIS — E1122 Type 2 diabetes mellitus with diabetic chronic kidney disease: Secondary | ICD-10-CM

## 2023-09-11 DIAGNOSIS — E1159 Type 2 diabetes mellitus with other circulatory complications: Secondary | ICD-10-CM | POA: Diagnosis not present

## 2023-09-11 DIAGNOSIS — E1169 Type 2 diabetes mellitus with other specified complication: Secondary | ICD-10-CM | POA: Diagnosis not present

## 2023-09-11 DIAGNOSIS — N182 Chronic kidney disease, stage 2 (mild): Secondary | ICD-10-CM

## 2023-09-11 DIAGNOSIS — Z7984 Long term (current) use of oral hypoglycemic drugs: Secondary | ICD-10-CM

## 2023-09-11 LAB — BAYER DCA HB A1C WAIVED: HB A1C (BAYER DCA - WAIVED): 6.4 % — ABNORMAL HIGH (ref 4.8–5.6)

## 2023-09-11 MED ORDER — LISINOPRIL 2.5 MG PO TABS: 2.5000 mg | ORAL_TABLET | Freq: Every day | ORAL | 3 refills | Status: DC

## 2023-09-11 NOTE — Patient Instructions (Signed)

## 2023-09-11 NOTE — Progress Notes (Signed)
Subjective:  Patient ID: Andres Robinson, male    DOB: 10-16-1956, 67 y.o.   MRN: [id number]  Patient Care Team: Sonny Masters, FNP as PCP - General (Family Medicine) Roma Kayser, MD as Consulting Physician (Endocrinology)   Chief Complaint:  Medical Management of Chronic Issues   HPI: Andres Robinson is a 67 y.o. male presenting on 09/11/2023 for Medical Management of Chronic Issues    1. Type 2 diabetes mellitus with other specified complication, without long-term current use of insulin (HCC) He is followed by Dr. Fransico Him on a regular basis. He denies polyuria, polyphagia, or polydipsia. Compliant with medications without associated side effects.   2. CKD stage 2 due to type 2 diabetes mellitus (HCC) He has not started ACEi as prescribed. States he was unaware it was sent to the pharmacy.   3. Hypertension associated with diabetes (HCC) He is not on medications at this time. Denies headaches, leg swelling, chest pain, palpitations, vision changes, weakness, or confusion.   4. Hyperlipidemia associated with type 2 diabetes mellitus (HCC) On statin therapy and tolerating well. Does try to follow a good diet and is active daily.      Relevant past medical, surgical, family, and social history reviewed and updated as indicated.  Allergies and medications reviewed and updated. Data reviewed: Chart in Epic.   Past Medical History:  Diagnosis Date   DDD (degenerative disc disease), cervical    DDD (degenerative disc disease), lumbar    Essential hypertension, benign 08/31/2022   High cholesterol    Normal cardiac stress test 2005   RMSF [facility] spotted fever) 2013   Type 2 diabetes mellitus with stage 2 chronic kidney disease, without long-term current use of insulin (HCC) 09/16/2019    Past Surgical History:  Procedure Laterality Date   CATARACT EXTRACTION Bilateral    COLONOSCOPY     COLONOSCOPY WITH PROPOFOL N/A 10/12/2021   Procedure: COLONOSCOPY WITH  PROPOFOL;  Surgeon: Dolores Frame, MD;  Location: [facility];  Service: Gastroenterology;  Laterality: N/A;  10:45   ESOPHAGOGASTRODUODENOSCOPY N/A 01/14/2014   Procedure: ESOPHAGOGASTRODUODENOSCOPY (EGD);  Surgeon: Malissa Hippo, MD;  Location: [facility];  Service: Endoscopy;  Laterality: N/A;  320   POLYPECTOMY  10/12/2021   Procedure: POLYPECTOMY INTESTINAL;  Surgeon: Dolores Frame, MD;  Location: [facility];  Service: Gastroenterology;;   VASECTOMY      Social History   Socioeconomic History   Marital status: Married    Spouse name: Tena    Number of children: 0   Years of education: Not on file   Highest education level: Some college, no degree  Occupational History   Not on file  Tobacco Use   Smoking status: Former    Current packs/day: 0.00    Types: Cigarettes    Quit date: 1982    Years since quitting: 42.7   Smokeless tobacco: Never   Tobacco comments:    quit in 1982  Vaping Use   Vaping status: Never Used  Substance and Sexual Activity   Alcohol use: Not Currently    Alcohol/week: 7.0 standard drinks of alcohol    Types: 7 Standard drinks or equivalent per week   Drug use: No   Sexual activity: Not Currently    Partners: Female  Other Topics Concern   Not on file  Social History Narrative   Lives with Tena married since 1981      Pets: 1 boston terrier -deaf  and one eye: Celine Mans      Enjoys: hunt and being outside      Diet: eat all food groups   Caffeine: 4 cups of coffee   Water: 4 glasses a day      Wears seat belt    Smoke detectors    Does not use phone while driving   Social Determinants of Health   Financial Resource Strain: Low Risk  (03/02/2023)   Overall Financial Resource Strain (CARDIA)    Difficulty of Paying Living Expenses: Not hard at all  Food Insecurity: No Food Insecurity (03/02/2023)   Hunger Vital Sign    Worried About Running Out of Food in the Last Year: Never true    Ran Out of Food in  the Last Year: Never true  Transportation Needs: No Transportation Needs (05/02/2021)   PRAPARE - Administrator, Civil Service (Medical): No    Lack of Transportation (Non-Medical): No  Physical Activity: Insufficiently Active (03/02/2023)   Exercise Vital Sign    Days of Exercise per Week: 3 days    Minutes of Exercise per Session: 30 min  Stress: No Stress Concern Present (03/02/2023)   [employer] of Occupational Health - Occupational Stress Questionnaire    Feeling of Stress : Not at all  Social Connections: Moderately Integrated (03/02/2023)   Social Connection and Isolation Panel [NHANES]    Frequency of Communication with Friends and Family: More than three times a week    Frequency of Social Gatherings with Friends and Family: More than three times a week    Attends Religious Services: More than 4 times per year    Active Member of [employer] or Organizations: No    Attends Banker Meetings: Never    Marital Status: Married  Catering manager Violence: Not At Risk (03/02/2023)   Humiliation, Afraid, Rape, and Kick questionnaire    Fear of Current or Ex-Partner: No    Emotionally Abused: No    Physically Abused: No    Sexually Abused: No    Outpatient Encounter Medications as of 09/11/2023  Medication Sig   acetaminophen (TYLENOL) 650 MG CR tablet Take 1,300 mg by mouth every 8 (eight) hours as needed for pain.   diphenhydramine-acetaminophen (TYLENOL PM) 25-500 MG TABS tablet Take 1 tablet by mouth at bedtime as needed (sleep).   GLUCOSAMINE-CHONDROITIN PO Take 2 tablets by mouth daily as needed.   metFORMIN (GLUCOPHAGE) 500 MG tablet TAKE ONE TABLET ONCE DAILY WITH BREAKFAST   Omega-3 Fatty Acids (FISH OIL ULTRA) 1400 MG CAPS Take 1,400 mg by mouth 2 (two) times daily with a meal.   ONETOUCH ULTRA test strip CHECK BLOOD SUGAR ONCE ANDAY   oxymetazoline (AFRIN) 0.05 % nasal spray Place 1 spray into both nostrils 2 (two) times daily as needed for congestion.    simvastatin (ZOCOR) 40 MG tablet Take 1 tablet (40 mg total) by mouth daily at 6 PM.   tamsulosin (FLOMAX) 0.4 MG CAPS capsule Take 1 capsule (0.4 mg total) by mouth 2 (two) times daily.   [DISCONTINUED] lisinopril (ZESTRIL) 2.5 MG tablet Take 1 tablet (2.5 mg total) by mouth daily.   lisinopril (ZESTRIL) 2.5 MG tablet Take 1 tablet (2.5 mg total) by mouth daily.   No facility-administered encounter medications on file as of 09/11/2023.    Allergies  Allergen Reactions   Penicillins Other (See Comments)    Childhood Reaction     Review of Systems  Constitutional:  Negative for activity change,  appetite change, chills, diaphoresis, fatigue, fever and unexpected weight change.  HENT: Negative.    Eyes: Negative.  Negative for photophobia and visual disturbance.  Respiratory:  Negative for cough, chest tightness and shortness of breath.   Cardiovascular:  Negative for chest pain, palpitations and leg swelling.  Gastrointestinal:  Negative for abdominal pain, blood in stool, constipation, diarrhea, nausea and vomiting.  Endocrine: Negative.  Negative for cold intolerance, heat intolerance, polydipsia, polyphagia and polyuria.  Genitourinary:  Negative for decreased urine volume, difficulty urinating, dysuria, frequency and urgency.  Musculoskeletal:  Negative for arthralgias and myalgias.  Skin: Negative.   Allergic/Immunologic: Negative.   Neurological:  Negative for dizziness, tremors, seizures, syncope, facial asymmetry, speech difficulty, weakness, light-headedness, numbness and headaches.  Hematological: Negative.   Psychiatric/Behavioral:  Negative for confusion, hallucinations, sleep disturbance and suicidal ideas.   All other systems reviewed and are negative.       Objective:  BP (!) 143/83   Pulse 71   Temp 97.7 F (36.5 C) (Temporal)   Ht 5\' 11"  (1.803 m)   Wt 237 lb 12.8 oz (107.9 kg)   SpO2 97%   BMI 33.17 kg/m    Wt Readings from Last 3 Encounters:  09/11/23  237 lb 12.8 oz (107.9 kg)  03/06/23 236 lb (107 kg)  03/02/23 236 lb (107 kg)    Physical Exam Vitals and nursing note reviewed.  Constitutional:      General: He is not in acute distress.    Appearance: Normal appearance. He is well-developed and well-groomed. He is obese. He is not ill-appearing, toxic-appearing or diaphoretic.  HENT:     Head: Normocephalic and atraumatic.     Jaw: There is normal jaw occlusion.     Right Ear: Hearing normal.     Left Ear: Hearing normal.     Nose: Nose normal.     Mouth/Throat:     Lips: Pink.     Mouth: Mucous membranes are moist.     Pharynx: Oropharynx is clear. Uvula midline.  Eyes:     General: Lids are normal.     Extraocular Movements: Extraocular movements intact.     Conjunctiva/sclera: Conjunctivae normal.     Pupils: Pupils are equal, round, and reactive to light.  Neck:     Thyroid: No thyroid mass, thyromegaly or thyroid tenderness.     Vascular: No carotid bruit or JVD.     Trachea: Trachea and phonation normal.  Cardiovascular:     Rate and Rhythm: Normal rate and regular rhythm.     Chest Wall: PMI is not displaced.     Pulses: Normal pulses.     Heart sounds: Normal heart sounds. No murmur heard.    No friction rub. No gallop.  Pulmonary:     Effort: Pulmonary effort is normal. No respiratory distress.     Breath sounds: Normal breath sounds. No wheezing.  Abdominal:     General: Bowel sounds are normal. There is no distension or abdominal bruit.     Palpations: Abdomen is soft. There is no hepatomegaly or splenomegaly.     Tenderness: There is no abdominal tenderness. There is no right CVA tenderness or left CVA tenderness.     Hernia: No hernia is present.  Musculoskeletal:        General: Normal range of motion.     Cervical back: Normal range of motion and neck supple.     Right lower leg: No edema.     Left lower leg: No  edema.  Lymphadenopathy:     Cervical: No cervical adenopathy.  Skin:    General: Skin  is warm and dry.     Capillary Refill: Capillary refill takes less than 2 seconds.     Coloration: Skin is not cyanotic, jaundiced or pale.     Findings: No rash.  Neurological:     General: No focal deficit present.     Mental Status: He is alert and oriented to person, place, and time.     Sensory: Sensation is intact.     Motor: Motor function is intact.     Coordination: Coordination is intact.     Gait: Gait is intact.     Deep Tendon Reflexes: Reflexes are normal and symmetric.  Psychiatric:        Attention and Perception: Attention and perception normal.        Mood and Affect: Mood and affect normal.        Speech: Speech normal.        Behavior: Behavior normal. Behavior is cooperative.        Thought Content: Thought content normal.        Cognition and Memory: Cognition and memory normal.        Judgment: Judgment normal.     Results for orders placed or performed in visit on 03/06/23  CBC with Differential/Platelet  Result Value Ref Range   WBC 5.1 3.4 - 10.8 x10E3/uL   RBC 4.91 4.14 - 5.80 x10E6/uL   Hemoglobin 15.2 13.0 - 17.7 g/dL   Hematocrit 78.2 95.6 - 51.0 %   MCV 93 79 - 97 fL   MCH 31.0 26.6 - 33.0 pg   MCHC 33.2 31.5 - 35.7 g/dL   RDW 21.3 08.6 - 57.8 %   Platelets 232 150 - 450 x10E3/uL   Neutrophils 58 Not Estab. %   Lymphs 23 Not Estab. %   Monocytes 9 Not Estab. %   Eos 8 Not Estab. %   Basos 2 Not Estab. %   Neutrophils Absolute 3.0 1.4 - 7.0 x10E3/uL   Lymphocytes Absolute 1.2 0.7 - 3.1 x10E3/uL   Monocytes Absolute 0.5 0.1 - 0.9 x10E3/uL   EOS (ABSOLUTE) 0.4 0.0 - 0.4 x10E3/uL   Basophils Absolute 0.1 0.0 - 0.2 x10E3/uL   Immature Granulocytes 0 Not Estab. %   Immature Grans (Abs) 0.0 0.0 - 0.1 x10E3/uL  CMP14+EGFR  Result Value Ref Range   Glucose 154 (H) 70 - 99 mg/dL   BUN 20 8 - 27 mg/dL   Creatinine, Ser 4.69 0.76 - 1.27 mg/dL   eGFR 67 >62 XB/MWU/1.32   BUN/Creatinine Ratio 17 10 - 24   Sodium 141 134 - 144 mmol/L   Potassium  4.9 3.5 - 5.2 mmol/L   Chloride 104 96 - 106 mmol/L   CO2 21 20 - 29 mmol/L   Calcium 9.7 8.6 - 10.2 mg/dL   Total Protein 7.1 6.0 - 8.5 g/dL   Albumin 4.6 3.9 - 4.9 g/dL   Globulin, Total 2.5 1.5 - 4.5 g/dL   Albumin/Globulin Ratio 1.8 1.2 - 2.2   Bilirubin Total 0.7 0.0 - 1.2 mg/dL   Alkaline Phosphatase 70 44 - 121 IU/L   AST 21 0 - 40 IU/L   ALT 23 0 - 44 IU/L  Thyroid Panel With TSH  Result Value Ref Range   TSH 1.880 0.450 - 4.500 uIU/mL   T4, Total 7.0 4.5 - 12.0 ug/dL   T3 Uptake Ratio 27 24 -  39 %   Free Thyroxine Index 1.9 1.2 - 4.9  Vitamin B12  Result Value Ref Range   Vitamin B-12 401 232 - 1,245 pg/mL  PSA, total and free  Result Value Ref Range   Prostate Specific Ag, Serum 1.3 0.0 - 4.0 ng/mL   PSA, Free 0.25 N/A ng/mL   PSA, Free Pct 19.2 %       Pertinent labs & imaging results that were available during my care of the patient were reviewed by me and considered in my medical decision making.  Assessment & Plan:  Codyjames was seen today for medical management of chronic issues.  Diagnoses and all orders for this visit:  Type 2 diabetes mellitus with other specified complication, without long-term current use of insulin (HCC) Followed by endocrinology. Will start low dose ACEi. Labs pending, diet and exercise encouraged.  -     lisinopril (ZESTRIL) 2.5 MG tablet; Take 1 tablet (2.5 mg total) by mouth daily. -     CBC with Differential/Platelet -     CMP14+EGFR -     Lipid panel -     Thyroid Panel With TSH -     Microalbumin / creatinine urine ratio -     Bayer DCA Hb A1c Waived  CKD stage 2 due to type 2 diabetes mellitus (HCC) Will start ACEi for renal protection. DASH diet and exercise encouraged.  -     lisinopril (ZESTRIL) 2.5 MG tablet; Take 1 tablet (2.5 mg total) by mouth daily. -     CBC with Differential/Platelet -     CMP14+EGFR -     Microalbumin / creatinine urine ratio -     Bayer DCA Hb A1c Waived  Hypertension associated with diabetes  (HCC) Start low dose ACEi. Other labs pending. Diet and exercise encouraged.  -     CBC with Differential/Platelet -     CMP14+EGFR -     Lipid panel -     Thyroid Panel With TSH -     Microalbumin / creatinine urine ratio -     Bayer DCA Hb A1c Waived  Hyperlipidemia associated with type 2 diabetes mellitus (HCC) Diet encouraged - increase intake of fresh fruits and vegetables, increase intake of lean proteins. Bake, broil, or grill foods. Avoid fried, greasy, and fatty foods. Avoid fast foods. Increase intake of fiber-rich whole grains. Exercise encouraged - at least 150 minutes per week and advance as tolerated.  Goal BMI < 25. Continue medications as prescribed. Follow up in 3-6 months as discussed.  -     CMP14+EGFR -     Lipid panel     Continue all other maintenance medications.  Follow up plan: Return in about 6 months (around 03/10/2024) for CPE.   Continue healthy lifestyle choices, including diet (rich in fruits, vegetables, and lean proteins, and low in salt and simple carbohydrates) and exercise (at least 30 minutes of moderate physical activity daily).  Educational handout given for DM  The above assessment and management plan was discussed with the patient. The patient verbalized understanding of and has agreed to the management plan. Patient is aware to call the clinic if they develop any new symptoms or if symptoms persist or worsen. Patient is aware when to return to the clinic for a follow-up visit. Patient educated on when it is appropriate to go to the emergency department.   Kari Baars, FNP-C [facility] [phone]

## 2023-09-12 LAB — CMP14+EGFR
ALT: 25 IU/L (ref 0–44)
AST: 24 IU/L (ref 0–40)
Albumin: 4.4 g/dL (ref 3.9–4.9)
Alkaline Phosphatase: 55 IU/L (ref 44–121)
BUN/Creatinine Ratio: 19 (ref 10–24)
BUN: 21 mg/dL (ref 8–27)
Bilirubin Total: 0.5 mg/dL (ref 0.0–1.2)
CO2: 22 mmol/L (ref 20–29)
Calcium: 9.4 mg/dL (ref 8.6–10.2)
Chloride: 104 mmol/L (ref 96–106)
Creatinine, Ser: 1.11 mg/dL (ref 0.76–1.27)
Globulin, Total: 2.8 g/dL (ref 1.5–4.5)
Glucose: 123 mg/dL — ABNORMAL HIGH (ref 70–99)
Potassium: 4.4 mmol/L (ref 3.5–5.2)
Sodium: 143 mmol/L (ref 134–144)
Total Protein: 7.2 g/dL (ref 6.0–8.5)
eGFR: 72 mL/min/{1.73_m2} (ref >59)

## 2023-09-12 LAB — CBC WITH DIFFERENTIAL/PLATELET
Basophils Absolute: 0.1 10*3/uL (ref 0.0–0.2)
Basos: 1 %
EOS (ABSOLUTE): 0.2 10*3/uL (ref 0.0–0.4)
Eos: 2 %
Hematocrit: 41.9 % (ref 37.5–51.0)
Hemoglobin: 14 g/dL (ref 13.0–17.7)
Immature Grans (Abs): 0 10*3/uL (ref 0.0–0.1)
Immature Granulocytes: 0 %
Lymphocytes Absolute: 1 10*3/uL (ref 0.7–3.1)
Lymphs: 13 %
MCH: 31.5 pg (ref 26.6–33.0)
MCHC: 33.4 g/dL (ref 31.5–35.7)
MCV: 94 fL (ref 79–97)
Monocytes Absolute: 0.5 10*3/uL (ref 0.1–0.9)
Monocytes: 7 %
Neutrophils Absolute: 5.9 10*3/uL (ref 1.4–7.0)
Neutrophils: 77 %
Platelets: 207 10*3/uL (ref 150–450)
RBC: 4.44 x10E6/uL (ref 4.14–5.80)
RDW: 13.1 % (ref 11.6–15.4)
WBC: 7.6 10*3/uL (ref 3.4–10.8)

## 2023-09-12 LAB — THYROID PANEL WITH TSH
Free Thyroxine Index: 1.7 (ref 1.2–4.9)
T3 Uptake Ratio: 27 % (ref 24–39)
T4, Total: 6.4 ug/dL (ref 4.5–12.0)
TSH: 2.16 u[IU]/mL (ref 0.450–4.500)

## 2023-09-12 LAB — LIPID PANEL
Chol/HDL Ratio: 3.9 ratio (ref 0.0–5.0)
Cholesterol, Total: 135 mg/dL (ref 100–199)
HDL: 35 mg/dL — ABNORMAL LOW (ref >39)
LDL Chol Calc (NIH): 45 mg/dL (ref 0–99)
Triglycerides: 372 mg/dL — ABNORMAL HIGH (ref 0–149)
VLDL Cholesterol Cal: 55 mg/dL — ABNORMAL HIGH (ref 5–40)

## 2023-09-18 ENCOUNTER — Encounter: Payer: Self-pay | Admitting: Family Medicine

## 2024-02-25 ENCOUNTER — Other Ambulatory Visit: Payer: Self-pay | Admitting: "Endocrinology

## 2024-02-26 ENCOUNTER — Other Ambulatory Visit: Payer: Self-pay

## 2024-02-26 ENCOUNTER — Telehealth: Payer: Self-pay | Admitting: "Endocrinology

## 2024-02-26 DIAGNOSIS — E66811 Obesity, class 1: Secondary | ICD-10-CM

## 2024-02-26 DIAGNOSIS — N182 Chronic kidney disease, stage 2 (mild): Secondary | ICD-10-CM

## 2024-02-26 DIAGNOSIS — E782 Mixed hyperlipidemia: Secondary | ICD-10-CM

## 2024-02-26 DIAGNOSIS — I10 Essential (primary) hypertension: Secondary | ICD-10-CM

## 2024-02-26 NOTE — Telephone Encounter (Signed)
 Please update labs

## 2024-02-27 NOTE — Telephone Encounter (Signed)
 Labs updated and sent to [facility].

## 2024-02-28 LAB — COMPREHENSIVE METABOLIC PANEL
ALT: 26 IU/L (ref 0–44)
AST: 25 IU/L (ref 0–40)
Albumin: 4.4 g/dL (ref 3.9–4.9)
Alkaline Phosphatase: 66 IU/L (ref 44–121)
BUN/Creatinine Ratio: 16 (ref 10–24)
BUN: 19 mg/dL (ref 8–27)
Bilirubin Total: 0.7 mg/dL (ref 0.0–1.2)
CO2: 22 mmol/L (ref 20–29)
Calcium: 9.5 mg/dL (ref 8.6–10.2)
Chloride: 104 mmol/L (ref 96–106)
Creatinine, Ser: 1.2 mg/dL (ref 0.76–1.27)
Globulin, Total: 2.9 g/dL (ref 1.5–4.5)
Glucose: 152 mg/dL — ABNORMAL HIGH (ref 70–99)
Potassium: 5 mmol/L (ref 3.5–5.2)
Sodium: 141 mmol/L (ref 134–144)
Total Protein: 7.3 g/dL (ref 6.0–8.5)
eGFR: 65 mL/min/{1.73_m2} (ref >59)

## 2024-02-28 LAB — LIPID PANEL
Chol/HDL Ratio: 2.8 ratio (ref 0.0–5.0)
Cholesterol, Total: 111 mg/dL (ref 100–199)
HDL: 40 mg/dL (ref >39)
LDL Chol Calc (NIH): 43 mg/dL (ref 0–99)
Triglycerides: 171 mg/dL — ABNORMAL HIGH (ref 0–149)
VLDL Cholesterol Cal: 28 mg/dL (ref 5–40)

## 2024-02-28 LAB — VITAMIN B12: Vitamin B-12: 370 pg/mL (ref 232–1245)

## 2024-02-28 LAB — VITAMIN D 25 HYDROXY (VIT D DEFICIENCY, FRACTURES): Vit D, 25-Hydroxy: 30.2 ng/mL (ref 30.0–100.0)

## 2024-03-03 ENCOUNTER — Ambulatory Visit: Payer: Medicare HMO | Admitting: "Endocrinology

## 2024-03-03 ENCOUNTER — Encounter: Payer: Self-pay | Admitting: "Endocrinology

## 2024-03-03 VITALS — BP 152/96 | HR 76 | Ht 71.0 in | Wt 233.8 lb

## 2024-03-03 DIAGNOSIS — E782 Mixed hyperlipidemia: Secondary | ICD-10-CM | POA: Diagnosis not present

## 2024-03-03 DIAGNOSIS — E1122 Type 2 diabetes mellitus with diabetic chronic kidney disease: Secondary | ICD-10-CM

## 2024-03-03 DIAGNOSIS — N182 Chronic kidney disease, stage 2 (mild): Secondary | ICD-10-CM | POA: Diagnosis not present

## 2024-03-03 DIAGNOSIS — E1169 Type 2 diabetes mellitus with other specified complication: Secondary | ICD-10-CM | POA: Diagnosis not present

## 2024-03-03 DIAGNOSIS — E6609 Other obesity due to excess calories: Secondary | ICD-10-CM

## 2024-03-03 DIAGNOSIS — Z7984 Long term (current) use of oral hypoglycemic drugs: Secondary | ICD-10-CM

## 2024-03-03 DIAGNOSIS — I10 Essential (primary) hypertension: Secondary | ICD-10-CM

## 2024-03-03 DIAGNOSIS — E66811 Obesity, class 1: Secondary | ICD-10-CM

## 2024-03-03 DIAGNOSIS — Z6832 Body mass index (BMI) 32.0-32.9, adult: Secondary | ICD-10-CM

## 2024-03-03 LAB — POCT GLYCOSYLATED HEMOGLOBIN (HGB A1C): HbA1c, POC (controlled diabetic range): 6.6 % (ref 0.0–7.0)

## 2024-03-03 NOTE — Progress Notes (Signed)
 03/03/2024, 1:33 PM  Endocrinology follow-up note   Subjective:    Patient ID: Andres Robinson, male    DOB: Apr 25, 1956.  Andres Robinson is being seen in  Follow up after he was seen in consultation for management of currently uncontrolled symptomatic diabetes requested by  Sonny Masters, FNP.   Past Medical History:  Diagnosis Date   DDD (degenerative disc disease), cervical    DDD (degenerative disc disease), lumbar    Essential hypertension, benign 08/31/2022   High cholesterol    Normal cardiac stress test 2005   [facility] spotted fever) 2013   Type 2 diabetes mellitus with stage 2 chronic kidney disease, without long-term current use of insulin (HCC) 09/16/2019    Past Surgical History:  Procedure Laterality Date   CATARACT EXTRACTION Bilateral    COLONOSCOPY     COLONOSCOPY WITH PROPOFOL N/A 10/12/2021   Procedure: COLONOSCOPY WITH PROPOFOL;  Surgeon: Dolores Frame, MD;  [facility];  Service: Gastroenterology;  Laterality: N/A;  10:45   ESOPHAGOGASTRODUODENOSCOPY N/A 01/14/2014   Procedure: ESOPHAGOGASTRODUODENOSCOPY (EGD);  Surgeon: Malissa Hippo, MD;  Location: [facility];  Service: Endoscopy;  Laterality: N/A;  320   POLYPECTOMY  10/12/2021   Procedure: POLYPECTOMY INTESTINAL;  Surgeon: Marguerita Merles, Reuel Boom, MD;  Location: [facility];  Service: Gastroenterology;;   VASECTOMY      Social History   Socioeconomic History   Marital status: Married    Spouse name: Tena    Number of children: 0   Years of education: Not on file   Highest education level: Some college, no degree  Occupational History   Not on file  Tobacco Use   Smoking status: Former    Current packs/day: 0.00    Types: Cigarettes    Quit date: 1982    Years since quitting: 43.2   Smokeless tobacco: Never   Tobacco comments:    quit in 1982  Vaping Use   Vaping status: Never Used  Substance and Sexual Activity    Alcohol use: Not Currently    Alcohol/week: 7.0 standard drinks of alcohol    Types: 7 Standard drinks or equivalent per week   Drug use: No   Sexual activity: Not Currently    Partners: Female  Other Topics Concern   Not on file  Social History Narrative   Lives with Tena married since 1981      Pets: 1 boston terrier -deaf and one eye: Celine Mans      Enjoys: hunt and being outside      Diet: eat all food groups   Caffeine: 4 cups of coffee   Water: 4 glasses a day      Wears seat belt    Smoke detectors    Does not use phone while driving   Social Drivers of Corporate investment banker Strain: Low Risk  (03/02/2023)   Overall Financial Resource Strain (CARDIA)    Difficulty of Paying Living Expenses: Not hard at all  Food Insecurity: No Food Insecurity (03/02/2023)   Hunger Vital Sign    Worried About Running Out of Food in the Last Year: Never true    Ran Out of Food in the Last Year: Never true  Transportation Needs: No Transportation Needs (05/02/2021)   PRAPARE - Administrator, Civil Service (Medical): No    Lack of Transportation (Non-Medical): No  Physical Activity: Insufficiently Active (03/02/2023)  Exercise Vital Sign    Days of Exercise per Week: 3 days    Minutes of Exercise per Session: 30 min  Stress: No Stress Concern Present (03/02/2023)   [employer] of Occupational Health - Occupational Stress Questionnaire    Feeling of Stress : Not at all  Social Connections: Moderately Integrated (03/02/2023)   Social Connection and Isolation Panel [NHANES]    Frequency of Communication with Friends and Family: More than three times a week    Frequency of Social Gatherings with Friends and Family: More than three times a week    Attends Religious Services: More than 4 times per year    Active Member of [employer] or Organizations: No    Attends Engineer, structural: Never    Marital Status: Married    Family History  Problem Relation Age of Onset    Early death Mother    Heart disease Father    Diabetes Father    Hyperlipidemia Father    Hypertension Father    AAA (abdominal aortic aneurysm) Father    Peripheral vascular disease Father        amputation   Heart Problems Father    Hypertension Brother    Heart attack Brother     Outpatient Encounter Medications as of 03/03/2024  Medication Sig   acetaminophen (TYLENOL) 650 MG CR tablet Take 1,300 mg by mouth every 8 (eight) hours as needed for pain.   diphenhydramine-acetaminophen (TYLENOL PM) 25-500 MG TABS tablet Take 1 tablet by mouth at bedtime as needed (sleep).   GLUCOSAMINE-CHONDROITIN PO Take 2 tablets by mouth daily as needed.   lisinopril (ZESTRIL) 2.5 MG tablet Take 1 tablet (2.5 mg total) by mouth daily. (Patient not taking: Reported on 03/03/2024)   metFORMIN (GLUCOPHAGE) 500 MG tablet TAKE ONE TABLET ONCE DAILY WITH BREAKFAST   Omega-3 Fatty Acids (FISH OIL ULTRA) 1400 MG CAPS Take 1,400 mg by mouth 2 (two) times daily with a meal.   ONETOUCH ULTRA test strip CHECK BLOOD SUGAR ONCE ANDAY   oxymetazoline (AFRIN) 0.05 % nasal spray Place 1 spray into both nostrils 2 (two) times daily as needed for congestion.   simvastatin (ZOCOR) 40 MG tablet Take 1 tablet (40 mg total) by mouth daily at 6 PM.   tamsulosin (FLOMAX) 0.4 MG CAPS capsule Take 1 capsule (0.4 mg total) by mouth 2 (two) times daily.   No facility-administered encounter medications on file as of 03/03/2024.    ALLERGIES: Allergies  Allergen Reactions   Penicillins Other (See Comments)    Childhood Reaction     VACCINATION STATUS: Immunization History  Administered Date(s) Administered   Fluad Quad(high Dose 65+) 09/29/2020   Moderna Sars-Covid-2 Vaccination 02/19/2020, 03/19/2020, 01/18/2021   Tdap 10/07/2018    Diabetes He presents for his follow-up diabetic visit. He has type 2 diabetes mellitus. Onset time: He was diagnosed at approximate age of 1 years. His disease course has been stable.  There are no hypoglycemic associated symptoms. Pertinent negatives for hypoglycemia include no confusion, headaches, pallor or seizures. There are no diabetic associated symptoms. Pertinent negatives for diabetes include no chest pain, no fatigue, no polydipsia, no polyphagia, no polyuria and no weakness. There are no hypoglycemic complications. Symptoms are stable. There are no diabetic complications. Risk factors for coronary artery disease include diabetes mellitus, dyslipidemia, male sex, obesity, tobacco exposure, sedentary lifestyle and family history. Current diabetic treatment includes oral agent (monotherapy). His weight is fluctuating minimally. He is following a diabetic diet. He has not  had a previous visit with a dietitian. He rarely participates in exercise. His home blood glucose trend is fluctuating minimally. His breakfast blood glucose range is generally 130-140 mg/dl. His overall blood glucose range is 130-140 mg/dl. (Mr. Lemoine presents with controlled fasting glycemic profile, point-of-care A1c of 6.6%.    ) An ACE inhibitor/angiotensin II receptor blocker is not being taken. Eye exam is current.  Hyperlipidemia This is a chronic problem. The current episode started more than 1 year ago. The problem is uncontrolled. Exacerbating diseases include diabetes and obesity. Pertinent negatives include no chest pain, myalgias or shortness of breath. Current antihyperlipidemic treatment includes statins. Risk factors for coronary artery disease include dyslipidemia, diabetes mellitus, obesity, male sex and a sedentary lifestyle.     Review of Systems  Constitutional:  Negative for chills, fatigue, fever and unexpected weight change.  HENT:  Negative for dental problem, mouth sores and trouble swallowing.   Eyes:  Negative for visual disturbance.  Respiratory:  Negative for cough, choking, chest tightness, shortness of breath and wheezing.   Cardiovascular:  Negative for chest pain,  palpitations and leg swelling.  Gastrointestinal:  Negative for abdominal distention, abdominal pain, constipation, diarrhea, nausea and vomiting.  Endocrine: Negative for polydipsia, polyphagia and polyuria.  Genitourinary:  Negative for dysuria, flank pain, hematuria and urgency.  Musculoskeletal:  Negative for back pain, gait problem, myalgias and neck pain.  Skin:  Negative for pallor, rash and wound.  Neurological:  Negative for seizures, syncope, weakness, numbness and headaches.  Psychiatric/Behavioral:  Negative for confusion and dysphoric mood.     Objective:    BP (!) 152/96   Pulse 76   Ht 5\' 11"  (1.803 m)   Wt 233 lb 12.8 oz (106.1 kg)   BMI 32.61 kg/m   Wt Readings from Last 3 Encounters:  03/03/24 233 lb 12.8 oz (106.1 kg)  09/11/23 237 lb 12.8 oz (107.9 kg)  03/06/23 236 lb (107 kg)        CMP     Component Value Date/Time   NA 141 02/27/2024 0827   K 5.0 02/27/2024 0827   CL 104 02/27/2024 0827   CO2 22 02/27/2024 0827   GLUCOSE 152 (H) 02/27/2024 0827   GLUCOSE 123 (H) 06/16/2020 0731   BUN 19 02/27/2024 0827   CREATININE 1.20 02/27/2024 0827   CREATININE 1.08 06/16/2020 0731   CALCIUM 9.5 02/27/2024 0827   PROT 7.3 02/27/2024 0827   ALBUMIN 4.4 02/27/2024 0827   AST 25 02/27/2024 0827   ALT 26 02/27/2024 0827   ALKPHOS 66 02/27/2024 0827   BILITOT 0.7 02/27/2024 0827   GFRNONAA 69 12/13/2020 1457   GFRNONAA 66 03/10/2020 0943   GFRAA 80 12/13/2020 1457   GFRAA 76 03/10/2020 0943     Diabetic Labs (most recent): Lab Results  Component Value Date   HGBA1C 6.6 03/03/2024   HGBA1C 6.4 (H) 09/11/2023   HGBA1C 6.3 03/01/2023     Lipid Panel ( most recent) Lipid Panel     Component Value Date/Time   CHOL 111 02/27/2024 0827   TRIG 171 (H) 02/27/2024 0827   HDL 40 02/27/2024 0827   CHOLHDL 2.8 02/27/2024 0827   CHOLHDL 2.8 03/10/2020 0943   LDLCALC 43 02/27/2024 0827   LDLCALC 55 03/10/2020 0943      Assessment & Plan:   1. Type 2  diabetes mellitus with stage 2 chronic kidney disease, without long-term current use of insulin (HCC)  - Harm Jou Goldring has currently controlled asymptomatic type  2 DM since  68 years of age.  Mr. Gorelik presents with controlled fasting glycemic profile, point-of-care A1c of 6.6%.      Recent labs reviewed. - I had a long discussion with him about the progressive nature of diabetes and the pathology behind its complications. -He does has mild microalbuminuria, no gross complications , however, he remains at a high risk for more acute and chronic complications which include CAD, CVA, CKD, retinopathy, and neuropathy. These are all discussed in detail with him.  - I have counseled him on diet  and weight management  by adopting a carbohydrate restricted/protein rich diet. Patient is encouraged to switch to  unprocessed or minimally processed     complex starch and increased protein intake (animal or plant source), fruits, and vegetables. -  he is advised to stick to a routine mealtimes to eat 3 meals  a day and avoid unnecessary snacks ( to snack only to correct hypoglycemia).   - he acknowledges that there is a room for improvement in his food and drink choices. - Suggestion is made for him to avoid simple carbohydrates  from his diet including Cakes, Sweet Desserts, Ice Cream, Soda (diet and regular), Sweet Tea, Candies, Chips, Cookies, Store Bought Juices, Alcohol , Artificial Sweeteners,  Coffee Creamer, and "Sugar-free" Products, Lemonade. This will help patient to have more stable blood glucose profile and potentially avoid unintended weight gain.  The following Lifestyle Medicine recommendations according to American College of Lifestyle Medicine  [facility]) were discussed and and offered to patient and he  agrees to start the journey:  A. Whole Foods, Plant-Based Nutrition comprising of fruits and vegetables, plant-based proteins, whole-grain carbohydrates was discussed in detail with the  patient.   A list for source of those nutrients were also provided to the patient.  Patient will use only water or unsweetened tea for hydration. B.  The need to stay away from risky substances including alcohol, smoking; obtaining 7 to 9 hours of restorative sleep, at least 150 minutes of moderate intensity exercise weekly, the importance of healthy social connections,  and stress management techniques were discussed. C.  A full color page of  Calorie density of various food groups per pound showing examples of each food groups was provided to the patient.   - he has been  scheduled with Norm Salt, RDN, CDE for diabetes education.  - I have approached him with the following individualized plan to manage  his diabetes and patient agrees:   - Based on his presentation with near target glycemic profile with A1c of 6.6%, he will not need insulin treatment for now.  Is advised to continue metformin 500 mg p.o. daily at breakfast.    -He has several options if he loses control of diabetes during his next visit, including glipizide therapy, insulin, incretin therapy as appropriate.  - Specific targets for  A1c;  LDL, HDL, Triglycerides,  were discussed with the patient.  2) Blood Pressure /Hypertension:  His blood pressure is not controlled to target.  Reportedly  he was given a prescription for lisinopril 2.5 mg p.o. daily.  However, patient did not continue this medication due to some dizziness.   In light of his send blood pressure at 152/96 mmHg at  clinic, he is advised to resume lisinopril at 5 mg p.o. daily.  He is also advised to start measuring his blood pressure 2-3 times daily for the next 10 days and average his blood pressure readings.  He is advised  to call clinic if blood pressure readings are averaging more than 130/80.  3) Lipids/Hyperlipidemia:   Review of his recent lipid panel showed controlled LDL at 43.  He is advised to continue Zocor 40 mg p.o. daily.   Whole food  plant-based diet will help with dyslipidemia.   Side effects and precautions discussed with him.      4)  Weight/Diet:  Body mass index is 32.61 kg/m.  -   clearly complicating his diabetes care.   he is  a candidate for weight loss. I discussed with him the fact that loss of 5 - 10% of his  current body weight will have the most impact on his diabetes management.  Exercise, and detailed carbohydrates information provided  -  detailed on discharge instructions.  5) Chronic Care/Health Maintenance:  -he  is on Statin medications and  is encouraged to initiate and continue to follow up with Ophthalmology, Dentist,  Podiatrist at least yearly or according to recommendations, and advised to   stay away from smoking. I have recommended yearly flu vaccine and pneumonia vaccine at least every 5 years; moderate intensity exercise for up to 150 minutes weekly; and  sleep for at least 7 hours a day.  His screening ABI was negative for PAD in September 2021, his study will be repeated in December 2026, or sooner if needed.   - he is  advised to maintain close follow up with Rakes, Doralee Albino, FNP for primary care needs, as well as his other providers for optimal and coordinated care.   I spent  26  minutes in the care of the patient today including review of labs from CMP, Lipids, Thyroid Function, Hematology (current and previous including abstractions from other facilities); face-to-face time discussing  his blood glucose readings/logs, discussing hypoglycemia and hyperglycemia episodes and symptoms, medications doses, his options of short and long term treatment based on the latest standards of care / guidelines;  discussion about incorporating lifestyle medicine;  and documenting the encounter. Risk reduction counseling performed per USPSTF guidelines to reduce  obesity and cardiovascular risk factors.     Please refer to Patient Instructions for Blood Glucose Monitoring and Insulin/Medications Dosing  Guide"  in media tab for additional information. Please  also refer to " Patient Self Inventory" in the Media  tab for reviewed elements of pertinent patient history.  [name] participated in the discussions, expressed understanding, and voiced agreement with the above plans.  All questions were answered to his satisfaction. he is encouraged to contact clinic should he have any questions or concerns prior to his return visit.     Follow up plan: - Return in about 6 months (around 09/03/2024) for A1c -NV.  Marquis Lunch, MD [facility] [address] [place], [place] [address] Phone: [phone]  Fax: [phone]    03/03/2024, 1:33 PM  This note was partially dictated with voice recognition software. Similar sounding words can be transcribed inadequately or may not  be corrected upon review.

## 2024-03-03 NOTE — Patient Instructions (Signed)

## 2024-03-04 ENCOUNTER — Ambulatory Visit: Payer: Medicare HMO

## 2024-03-04 VITALS — Ht 71.0 in | Wt 233.0 lb

## 2024-03-04 DIAGNOSIS — Z0000 Encounter for general adult medical examination without abnormal findings: Secondary | ICD-10-CM | POA: Diagnosis not present

## 2024-03-04 NOTE — Patient Instructions (Addendum)
 Mr. Andres Robinson , Thank you for taking time to come for your Medicare Wellness Visit. I appreciate your ongoing commitment to your health goals. Please review the following plan we discussed and let me know if I can assist you in the future.   Referrals/Orders/Follow-Ups/Clinician Recommendations: Aim for 30 minutes of exercise or brisk walking, 6-8 glasses of water, and 5 servings of fruits and vegetables each day.  This is a list of the screening recommended for you and due dates:  Health Maintenance  Topic Date Due   Zoster (Shingles) Vaccine (1 of 2) Never done   COVID-19 Vaccine (4 - 2024-25 season) 08/26/2023   Pneumonia Vaccine (1 of 2 - PCV) 03/05/2024*   Flu Shot  03/24/2024*   Hemoglobin A1C  09/03/2024   Yearly kidney health urinalysis for diabetes  09/10/2024   Colon Cancer Screening  10/12/2024   Eye exam for diabetics  12/31/2024   Yearly kidney function blood test for diabetes  02/26/2025   Medicare Annual Wellness Visit  03/04/2025   DTaP/Tdap/Td vaccine (2 - Td or Tdap) 10/07/2028   Hepatitis C Screening  Completed   HPV Vaccine  Aged Out   Complete foot exam   Discontinued  *Topic was postponed. The date shown is not the original due date.    Advanced directives: (ACP Link)Information on Advanced Care Planning can be found at [facility] [place] Advance Health Care Directives Advance Health Care Directives. [email or web address]   Next Medicare Annual Wellness Visit scheduled for next year: No

## 2024-03-04 NOTE — Progress Notes (Signed)
 Subjective:   Andres Robinson is a 68 y.o. who presents for a Medicare Wellness preventive visit.  Visit Complete: Virtual I connected with  Andres Robinson on 03/04/24 by a audio enabled telemedicine application and verified that I am speaking with the correct person using two identifiers.  Patient Location: Home  Provider Location: Home Office  I discussed the limitations of evaluation and management by telemedicine. The patient expressed understanding and agreed to proceed.  Vital Signs: Because this visit was a virtual/telehealth visit, some criteria may be missing or patient reported. Any vitals not documented were not able to be obtained and vitals that have been documented are patient reported.  VideoDeclined- This patient declined Librarian, academic. Therefore the visit was completed with audio only.  AWV Questionnaire: No: Patient Medicare AWV questionnaire was not completed prior to this visit.  Cardiac Risk Factors include: advanced age (>36men, >43 women);hypertension;male gender;dyslipidemia;diabetes mellitus     Objective:    Today's Vitals   03/04/24 0855  Weight: 233 lb (105.7 kg)  Height: 5\' 11"  (1.803 m)   Body mass index is 32.5 kg/m.     03/04/2024   10:33 AM 03/02/2023    8:25 AM 10/12/2021    9:59 AM 05/02/2021    3:51 PM 01/14/2014   11:34 AM  Advanced Directives  Does Patient Have a Medical Advance Directive? No Yes Yes Yes Patient does not have advance directive;Patient would not like information  Type of Customer service manager Power of [place];Living will Healthcare Power of [place];Living will Healthcare Power of [place];Living will   Does patient want to make changes to medical advance directive?    No - Patient declined   Copy of Healthcare Power of Attorney in Chart?  No - copy requested  No - copy requested   Would patient like information on creating a medical advance directive? Yes (MAU/Ambulatory/Procedural  Areas - Information given)        Current Medications (verified) Outpatient Encounter Medications as of 03/04/2024  Medication Sig   acetaminophen (TYLENOL) 650 MG CR tablet Take 1,300 mg by mouth every 8 (eight) hours as needed for pain.   diphenhydramine-acetaminophen (TYLENOL PM) 25-500 MG TABS tablet Take 1 tablet by mouth at bedtime as needed (sleep).   GLUCOSAMINE-CHONDROITIN PO Take 2 tablets by mouth daily as needed.   metFORMIN (GLUCOPHAGE) 500 MG tablet TAKE ONE TABLET ONCE DAILY WITH BREAKFAST   Omega-3 Fatty Acids (FISH OIL ULTRA) 1400 MG CAPS Take 1,400 mg by mouth 2 (two) times daily with a meal.   ONETOUCH ULTRA test strip CHECK BLOOD SUGAR ONCE ANDAY   oxymetazoline (AFRIN) 0.05 % nasal spray Place 1 spray into both nostrils 2 (two) times daily as needed for congestion.   simvastatin (ZOCOR) 40 MG tablet Take 1 tablet (40 mg total) by mouth daily at 6 PM.   tamsulosin (FLOMAX) 0.4 MG CAPS capsule Take 1 capsule (0.4 mg total) by mouth 2 (two) times daily.   lisinopril (ZESTRIL) 2.5 MG tablet Take 1 tablet (2.5 mg total) by mouth daily. (Patient not taking: Reported on 03/04/2024)   No facility-administered encounter medications on file as of 03/04/2024.    Allergies (verified) Penicillins   History: Past Medical History:  Diagnosis Date   DDD (degenerative disc disease), cervical    DDD (degenerative disc disease), lumbar    Essential hypertension, benign 08/31/2022   High cholesterol    Normal cardiac stress test 2005   [facility] spotted fever)  2013   Type 2 diabetes mellitus with stage 2 chronic kidney disease, without long-term current use of insulin (HCC) 09/16/2019   Past Surgical History:  Procedure Laterality Date   CATARACT EXTRACTION Bilateral    COLONOSCOPY     COLONOSCOPY WITH PROPOFOL N/A 10/12/2021   Procedure: COLONOSCOPY WITH PROPOFOL;  Surgeon: Dolores Frame, MD;  [facility];  Service: Gastroenterology;  Laterality:  N/A;  10:45   ESOPHAGOGASTRODUODENOSCOPY N/A 01/14/2014   Procedure: ESOPHAGOGASTRODUODENOSCOPY (EGD);  Surgeon: Malissa Hippo, MD;  Location: [facility];  Service: Endoscopy;  Laterality: N/A;  320   POLYPECTOMY  10/12/2021   Procedure: POLYPECTOMY INTESTINAL;  Surgeon: Marguerita Merles, Reuel Boom, MD;  Location: [facility];  Service: Gastroenterology;;   VASECTOMY     Family History  Problem Relation Age of Onset   Early death Mother    Heart disease Father    Diabetes Father    Hyperlipidemia Father    Hypertension Father    AAA (abdominal aortic aneurysm) Father    Peripheral vascular disease Father        amputation   Heart Problems Father    Hypertension Brother    Heart attack Brother    Social History   Socioeconomic History   Marital status: Married    Spouse name: Andres Robinson    Number of children: 0   Years of education: Not on file   Highest education level: Some college, no degree  Occupational History   Not on file  Tobacco Use   Smoking status: Former    Current packs/day: 0.00    Types: Cigarettes    Quit date: 1982    Years since quitting: 43.2   Smokeless tobacco: Never   Tobacco comments:    quit in 1982  Vaping Use   Vaping status: Never Used  Substance and Sexual Activity   Alcohol use: Not Currently    Alcohol/week: 7.0 standard drinks of alcohol    Types: 7 Standard drinks or equivalent per week   Drug use: No   Sexual activity: Not Currently    Partners: Female  Other Topics Concern   Not on file  Social History Narrative   Lives with Andres Robinson married since 1981      Pets: 1 boston terrier -deaf and one eye: Celine Mans      Enjoys: hunt and being outside      Diet: eat all food groups   Caffeine: 4 cups of coffee   Water: 4 glasses a day      Wears seat belt    Smoke detectors    Does not use phone while driving   Social Drivers of Corporate investment banker Strain: Low Risk  (03/04/2024)   Overall Financial Resource Strain  (CARDIA)    Difficulty of Paying Living Expenses: Not hard at all  Food Insecurity: No Food Insecurity (03/04/2024)   Hunger Vital Sign    Worried About Running Out of Food in the Last Year: Never true    Ran Out of Food in the Last Year: Never true  Transportation Needs: No Transportation Needs (03/04/2024)   PRAPARE - Administrator, Civil Service (Medical): No    Lack of Transportation (Non-Medical): No  Physical Activity: Insufficiently Active (03/04/2024)   Exercise Vital Sign    Days of Exercise per Week: 3 days    Minutes of Exercise per Session: 30 min  Stress: No Stress Concern Present (03/04/2024)   [employer] of  Occupational Health - Occupational Stress Questionnaire    Feeling of Stress : Not at all  Social Connections: Moderately Integrated (03/04/2024)   Social Connection and Isolation Panel [NHANES]    Frequency of Communication with Friends and Family: More than three times a week    Frequency of Social Gatherings with Friends and Family: Three times a week    Attends Religious Services: More than 4 times per year    Active Member of Clubs or Organizations: No    Attends Banker Meetings: Never    Marital Status: Married    Tobacco Counseling Counseling given: Not Answered Tobacco comments: quit in 1982    Clinical Intake:  Pre-visit preparation completed: Yes  Pain : No/denies pain     Diabetes: Yes CBG done?: No Did pt. bring in CBG monitor from home?: No  How often do you need to have someone help you when you read instructions, pamphlets, or other written materials from your doctor or pharmacy?: 1 - Never  Interpreter Needed?: No  Information entered by :: Kandis Fantasia LPN   Activities of Daily Living     03/04/2024    8:55 AM  In your present state of health, do you have any difficulty performing the following activities:  Hearing? 0  Vision? 0  Difficulty concentrating or making decisions? 0  Walking or  climbing stairs? 0  Dressing or bathing? 0  Doing errands, shopping? 0  Preparing Food and eating ? N  Using the Toilet? N  In the past six months, have you accidently leaked urine? N  Do you have problems with loss of bowel control? N  Managing your Medications? N  Managing your Finances? N  Housekeeping or managing your Housekeeping? N    Patient Care Team: Sonny Masters, FNP as PCP - General (Family Medicine) Roma Kayser, MD as Consulting Physician (Endocrinology)  Indicate any recent Medical Services you may have received from other than [facility] providers in the past year (date may be approximate).     Assessment:   This is a routine wellness examination for [place].  Hearing/Vision screen Hearing Screening - Comments:: Denies hearing difficulties   Vision Screening - Comments:: up to date with routine eye exams with Dr. Desiree Lucy     Goals Addressed             This Visit's Progress    Remain active and independent         Depression Screen     03/04/2024   10:31 AM 09/11/2023    2:18 PM 03/06/2023   12:21 PM 03/02/2023    8:25 AM 09/05/2022    8:52 AM 08/02/2022    1:08 PM 12/06/2021    1:23 PM  PHQ 2/9 Scores  PHQ - 2 Score 0 0 0 0 0 0 0  PHQ- 9 Score  0 0  0 0 0    Fall Risk     03/04/2024   10:34 AM 09/11/2023    2:18 PM 03/06/2023   12:21 PM 03/02/2023    8:24 AM 08/02/2022    1:08 PM  Fall Risk   Falls in the past year? 0 0 0 0 0  Number falls in past yr: 0   0   Injury with Fall? 0   0   Risk for fall due to : No Fall Risks   No Fall Risks   Follow up Falls prevention discussed;Education provided;Falls evaluation completed   Falls prevention discussed  MEDICARE RISK AT HOME:  Medicare Risk at Home Any stairs in or around the home?: No If so, are there any without handrails?: No Home free of loose throw rugs in walkways, pet beds, electrical cords, etc?: Yes Adequate lighting in your home to reduce risk of falls?: Yes Life alert?:  No Use of a cane, walker or w/c?: No Grab bars in the bathroom?: Yes Shower chair or bench in shower?: No Elevated toilet seat or a handicapped toilet?: Yes  TIMED UP AND GO:  Was the test performed?  No  Cognitive Function: 6CIT completed        03/04/2024   10:35 AM 03/02/2023    8:26 AM  6CIT Screen  What Year? 0 points 0 points  What month? 0 points 0 points  What time? 0 points 0 points  Count back from 20 0 points 0 points  Months in reverse 0 points 0 points  Repeat phrase 0 points 0 points  Total Score 0 points 0 points    Immunizations Immunization History  Administered Date(s) Administered   Fluad Quad(high Dose 65+) 09/29/2020   Moderna Sars-Covid-2 Vaccination 02/19/2020, 03/19/2020, 01/18/2021   Tdap 10/07/2018    Screening Tests Health Maintenance  Topic Date Due   Zoster Vaccines- Shingrix (1 of 2) Never done   COVID-19 Vaccine (4 - 2024-25 season) 08/26/2023   Pneumonia Vaccine 16+ Years old (1 of 2 - PCV) 03/05/2024 (Originally 02/12/1962)   INFLUENZA VACCINE  03/24/2024 (Originally 07/26/2023)   HEMOGLOBIN A1C  09/03/2024   Diabetic kidney evaluation - Urine ACR  09/10/2024   Colonoscopy  10/12/2024   OPHTHALMOLOGY EXAM  12/31/2024   Diabetic kidney evaluation - eGFR measurement  02/26/2025   Medicare Annual Wellness (AWV)  03/04/2025   DTaP/Tdap/Td (2 - Td or Tdap) 10/07/2028   Hepatitis C Screening  Completed   HPV VACCINES  Aged Out   FOOT EXAM  Discontinued    Health Maintenance  Health Maintenance Due  Topic Date Due   Zoster Vaccines- Shingrix (1 of 2) Never done   COVID-19 Vaccine (4 - 2024-25 season) 08/26/2023   Health Maintenance Items Addressed: Declines vaccines at this time   Additional Screening:  Vision Screening: Recommended annual ophthalmology exams for early detection of glaucoma and other disorders of the eye.  Dental Screening: Recommended annual dental exams for proper oral hygiene  Community Resource Referral /  Chronic Care Management: CRR required this visit?  No   CCM required this visit?  No     Plan:     I have personally reviewed and noted the following in the patient's chart:   Medical and social history Use of alcohol, tobacco or illicit drugs  Current medications and supplements including opioid prescriptions. Patient is not currently taking opioid prescriptions. Functional ability and status Nutritional status Physical activity Advanced directives List of other physicians Hospitalizations, surgeries, and ER visits in previous 12 months Vitals Screenings to include cognitive, depression, and falls Referrals and appointments  In addition, I have reviewed and discussed with patient certain preventive protocols, quality metrics, and best practice recommendations. A written personalized care plan for preventive services as well as general preventive health recommendations were provided to patient.     Kandis Fantasia [place], [place]   [id number]   After Visit Summary: (MyChart) Due to this being a telephonic visit, the after visit summary with patients personalized plan was offered to patient via MyChart   Notes: Nothing significant to report at this time.

## 2024-03-11 ENCOUNTER — Ambulatory Visit (INDEPENDENT_AMBULATORY_CARE_PROVIDER_SITE_OTHER): Payer: Medicare HMO | Admitting: Family Medicine

## 2024-03-11 ENCOUNTER — Encounter: Payer: Self-pay | Admitting: Family Medicine

## 2024-03-11 VITALS — BP 138/85 | HR 75 | Temp 97.6°F | Ht 71.0 in | Wt 235.2 lb

## 2024-03-11 DIAGNOSIS — N401 Enlarged prostate with lower urinary tract symptoms: Secondary | ICD-10-CM

## 2024-03-11 DIAGNOSIS — Z0001 Encounter for general adult medical examination with abnormal findings: Secondary | ICD-10-CM

## 2024-03-11 DIAGNOSIS — Z0000 Encounter for general adult medical examination without abnormal findings: Secondary | ICD-10-CM

## 2024-03-11 DIAGNOSIS — E1122 Type 2 diabetes mellitus with diabetic chronic kidney disease: Secondary | ICD-10-CM | POA: Diagnosis not present

## 2024-03-11 DIAGNOSIS — R300 Dysuria: Secondary | ICD-10-CM

## 2024-03-11 DIAGNOSIS — Z1211 Encounter for screening for malignant neoplasm of colon: Secondary | ICD-10-CM

## 2024-03-11 DIAGNOSIS — E1159 Type 2 diabetes mellitus with other circulatory complications: Secondary | ICD-10-CM

## 2024-03-11 DIAGNOSIS — N182 Chronic kidney disease, stage 2 (mild): Secondary | ICD-10-CM

## 2024-03-11 DIAGNOSIS — I152 Hypertension secondary to endocrine disorders: Secondary | ICD-10-CM

## 2024-03-11 DIAGNOSIS — R351 Nocturia: Secondary | ICD-10-CM

## 2024-03-11 DIAGNOSIS — N3000 Acute cystitis without hematuria: Secondary | ICD-10-CM

## 2024-03-11 DIAGNOSIS — E1169 Type 2 diabetes mellitus with other specified complication: Secondary | ICD-10-CM

## 2024-03-11 DIAGNOSIS — E785 Hyperlipidemia, unspecified: Secondary | ICD-10-CM

## 2024-03-11 LAB — URINALYSIS, ROUTINE W REFLEX MICROSCOPIC
Bilirubin, UA: NEGATIVE
Glucose, UA: NEGATIVE
Ketones, UA: NEGATIVE
Nitrite, UA: NEGATIVE
Protein,UA: NEGATIVE
RBC, UA: NEGATIVE
Specific Gravity, UA: 1.025 (ref 1.005–1.030)
Urobilinogen, Ur: 0.2 mg/dL (ref 0.2–1.0)
pH, UA: 5.5 (ref 5.0–7.5)

## 2024-03-11 LAB — MICROSCOPIC EXAMINATION
RBC, Urine: NONE SEEN /HPF (ref 0–2)
Renal Epithel, UA: NONE SEEN /HPF
Yeast, UA: NONE SEEN

## 2024-03-11 MED ORDER — ENALAPRIL MALEATE 2.5 MG PO TABS: 2.5000 mg | ORAL_TABLET | Freq: Every day | ORAL | 3 refills | Status: DC

## 2024-03-11 MED ORDER — LISINOPRIL 2.5 MG PO TABS: 2.5000 mg | ORAL_TABLET | Freq: Every day | ORAL | 3 refills | Status: DC

## 2024-03-11 MED ORDER — SULFAMETHOXAZOLE-TRIMETHOPRIM 800-160 MG PO TABS: 1.0000 | ORAL_TABLET | Freq: Two times a day (BID) | ORAL | 0 refills | Status: AC

## 2024-03-11 MED ORDER — SIMVASTATIN 40 MG PO TABS
40.0000 mg | ORAL_TABLET | Freq: Every day | ORAL | 3 refills | Status: AC
Start: 2024-03-11 — End: ?

## 2024-03-11 MED ORDER — TAMSULOSIN HCL 0.4 MG PO CAPS
0.4000 mg | ORAL_CAPSULE | Freq: Two times a day (BID) | ORAL | 3 refills | Status: AC
Start: 2024-03-11 — End: ?

## 2024-03-11 NOTE — Progress Notes (Signed)
 Complete physical exam  Patient: Andres Robinson   DOB: Jun 14, 1956   68 y.o. Male  MRN: [id number]  Subjective:    Chief Complaint  Patient presents with   Annual Exam    Andres Robinson is a 68 y.o. male who presents today for a complete physical exam. He reports consuming a general diet. The patient does not participate in regular exercise at present. He generally feels well. He reports sleeping well. He does not have additional problems to discuss today.   Discussed the use of AI scribe software for clinical note transcription with the patient, who gave verbal consent to proceed.  History of Present Illness   Andres Robinson is a 69 year old male who presents with urinary symptoms and for an annual physical exam.  He experiences nocturia, getting up at least twice per night to urinate. This morning, he had a burning sensation upon urination, which he notes has not occurred in a while. He has a history of prostatitis and recalls previous episodes of urinary tract infections. He denies hematuria or discharge. No pressure in the scrotum or rectum.  He has a history of elevated blood pressure and was previously prescribed lisinopril, which he could not tolerate due to fatigue. His blood pressure readings at home are around 130/80 mmHg after work. He does not take lisinopril currently.  His diabetes is currently well-managed with an A1c of 6.6%. His blood glucose was 152 mg/dL. He takes metformin after breakfast. He acknowledges the need for dietary improvements but is maintaining stable glucose levels.  His lipid profile has improved significantly. Triglycerides have decreased from 372 mg/dL in September to 119 mg/dL currently. His LDL cholesterol has decreased, and his HDL cholesterol has increased to 40 mg/dL. He takes Zocor at bedtime for cholesterol management and has tried several statins without issues.  He is up to date with vision and dental check-ups. He has had cataract surgery in  the past, and one eye is starting to glaze over but not enough to require intervention. He is due for a colonoscopy in October.      Most recent fall risk assessment:    03/04/2024   10:34 AM  Fall Risk   Falls in the past year? 0  Number falls in past yr: 0  Injury with Fall? 0  Risk for fall due to : No Fall Risks  Follow up Falls prevention discussed;Education provided;Falls evaluation completed     Most recent depression screenings:    03/04/2024   10:31 AM 09/11/2023    2:18 PM  PHQ 2/9 Scores  PHQ - 2 Score 0 0  PHQ- 9 Score  0    Vision:Within last year and Dental: No current dental problems and Receives regular dental care  Patient Active Problem List   Diagnosis Date Noted   CKD stage 2 due to type 2 diabetes mellitus (HCC) 03/06/2023   Hypertension associated with diabetes (HCC) 03/06/2023   Class 1 obesity due to excess calories with serious comorbidity and body mass index (BMI) of 32.0 to 32.9 in adult 02/28/2022   Benign prostatic hyperplasia 03/03/2020   Obesity (BMI 30-39.9) 03/03/2020   Diabetes mellitus (HCC) 09/16/2019   Hyperlipidemia associated with type 2 diabetes mellitus (HCC) 09/16/2019   Past Medical History:  Diagnosis Date   DDD (degenerative disc disease), cervical    DDD (degenerative disc disease), lumbar    Essential hypertension, benign 08/31/2022   High cholesterol    Normal cardiac stress test  2005   [facility] spotted fever) 2013   Type 2 diabetes mellitus with stage 2 chronic kidney disease, without long-term current use of insulin (HCC) 09/16/2019   Past Surgical History:  Procedure Laterality Date   CATARACT EXTRACTION Bilateral    COLONOSCOPY     COLONOSCOPY WITH PROPOFOL N/A 10/12/2021   Procedure: COLONOSCOPY WITH PROPOFOL;  Surgeon: Dolores Frame, MD;  [facility]: [facility];  Service: Gastroenterology;  Laterality: N/A;  10:45   ESOPHAGOGASTRODUODENOSCOPY N/A 01/14/2014   Procedure:  ESOPHAGOGASTRODUODENOSCOPY (EGD);  Surgeon: Malissa Hippo, MD;  Location: [facility];  Service: Endoscopy;  Laterality: N/A;  320   POLYPECTOMY  10/12/2021   Procedure: POLYPECTOMY INTESTINAL;  Surgeon: Marguerita Merles, Reuel Boom, MD;  Location: [facility];  Service: Gastroenterology;;   VASECTOMY     Social History   Tobacco Use   Smoking status: Former    Current packs/day: 0.00    Types: Cigarettes    Quit date: 1982    Years since quitting: 43.2   Smokeless tobacco: Never   Tobacco comments:    quit in 1982  Vaping Use   Vaping status: Never Used  Substance Use Topics   Alcohol use: Not Currently    Alcohol/week: 7.0 standard drinks of alcohol    Types: 7 Standard drinks or equivalent per week   Drug use: No   Social History   Socioeconomic History   Marital status: Married    Spouse name: Tena    Number of children: 0   Years of education: Not on file   Highest education level: Some college, no degree  Occupational History   Not on file  Tobacco Use   Smoking status: Former    Current packs/day: 0.00    Types: Cigarettes    Quit date: 1982    Years since quitting: 43.2   Smokeless tobacco: Never   Tobacco comments:    quit in 1982  Vaping Use   Vaping status: Never Used  Substance and Sexual Activity   Alcohol use: Not Currently    Alcohol/week: 7.0 standard drinks of alcohol    Types: 7 Standard drinks or equivalent per week   Drug use: No   Sexual activity: Not Currently    Partners: Female  Other Topics Concern   Not on file  Social History Narrative   Lives with Tena married since 1981      Pets: 1 boston terrier -deaf and one eye: Celine Mans      Enjoys: hunt and being outside      Diet: eat all food groups   Caffeine: 4 cups of coffee   Water: 4 glasses a day      Wears seat belt    Smoke detectors    Does not use phone while driving   Social Drivers of Corporate investment banker Strain: Low Risk  (03/04/2024)   Overall  Financial Resource Strain (CARDIA)    Difficulty of Paying Living Expenses: Not hard at all  Food Insecurity: No Food Insecurity (03/04/2024)   Hunger Vital Sign    Worried About Running Out of Food in the Last Year: Never true    Ran Out of Food in the Last Year: Never true  Transportation Needs: No Transportation Needs (03/04/2024)   PRAPARE - Administrator, Civil Service (Medical): No    Lack of Transportation (Non-Medical): No  Physical Activity: Insufficiently Active (03/04/2024)   Exercise Vital Sign    Days  of Exercise per Week: 3 days    Minutes of Exercise per Session: 30 min  Stress: No Stress Concern Present (03/04/2024)   [employer] of Occupational Health - Occupational Stress Questionnaire    Feeling of Stress : Not at all  Social Connections: Moderately Integrated (03/04/2024)   Social Connection and Isolation Panel [NHANES]    Frequency of Communication with Friends and Family: More than three times a week    Frequency of Social Gatherings with Friends and Family: Three times a week    Attends Religious Services: More than 4 times per year    Active Member of Clubs or Organizations: No    Attends Banker Meetings: Never    Marital Status: Married  Catering manager Violence: Not At Risk (03/04/2024)   Humiliation, Afraid, Rape, and Kick questionnaire    Fear of Current or Ex-Partner: No    Emotionally Abused: No    Physically Abused: No    Sexually Abused: No   Family Status  Relation Name Status   Mother  Deceased       Cerebral aneurysm.   Father  Deceased   Brother 1 Alive       Back problems    MGM  Deceased   MGF  Deceased  No partnership data on file   Family History  Problem Relation Age of Onset   Early death Mother    Heart disease Father    Diabetes Father    Hyperlipidemia Father    Hypertension Father    AAA (abdominal aortic aneurysm) Father    Peripheral vascular disease Father        amputation   Heart  Problems Father    Hypertension Brother    Heart attack Brother    Allergies  Allergen Reactions   Penicillins Other (See Comments)    Childhood Reaction       Patient Care Team: Tymara Saur, Doralee Albino, FNP as PCP - General (Family Medicine) Roma Kayser, MD as Consulting Physician (Endocrinology)   Outpatient Medications Prior to Visit  Medication Sig   acetaminophen (TYLENOL) 650 MG CR tablet Take 1,300 mg by mouth every 8 (eight) hours as needed for pain.   diphenhydramine-acetaminophen (TYLENOL PM) 25-500 MG TABS tablet Take 1 tablet by mouth at bedtime as needed (sleep).   GLUCOSAMINE-CHONDROITIN PO Take 2 tablets by mouth daily as needed.   metFORMIN (GLUCOPHAGE) 500 MG tablet TAKE ONE TABLET ONCE DAILY WITH BREAKFAST   Omega-3 Fatty Acids (FISH OIL ULTRA) 1400 MG CAPS Take 1,400 mg by mouth 2 (two) times daily with a meal.   ONETOUCH ULTRA test strip CHECK BLOOD SUGAR ONCE ANDAY   oxymetazoline (AFRIN) 0.05 % nasal spray Place 1 spray into both nostrils 2 (two) times daily as needed for congestion.   [DISCONTINUED] lisinopril (ZESTRIL) 2.5 MG tablet Take 1 tablet (2.5 mg total) by mouth daily.   [DISCONTINUED] simvastatin (ZOCOR) 40 MG tablet Take 1 tablet (40 mg total) by mouth daily at 6 PM.   [DISCONTINUED] tamsulosin (FLOMAX) 0.4 MG CAPS capsule Take 1 capsule (0.4 mg total) by mouth 2 (two) times daily.   No facility-administered medications prior to visit.    Review of Systems  Genitourinary:  Positive for dysuria.       Nocturia  All other systems reviewed and are negative.         Objective:     BP 138/85   Pulse 75   Temp 97.6 F (36.4 C)   Ht  5\' 11"  (1.803 m)   Wt 235 lb 3.2 oz (106.7 kg)   SpO2 97%   BMI 32.80 kg/m  BP Readings from Last 3 Encounters:  03/11/24 138/85  03/03/24 (!) 152/96  09/11/23 (!) 143/83   Wt Readings from Last 3 Encounters:  03/11/24 235 lb 3.2 oz (106.7 kg)  03/04/24 233 lb (105.7 kg)  03/03/24 233 lb 12.8 oz  (106.1 kg)   SpO2 Readings from Last 3 Encounters:  03/11/24 97%  09/11/23 97%  03/06/23 95%      Physical Exam Vitals and nursing note reviewed.  Constitutional:      General: He is not in acute distress.    Appearance: Normal appearance. He is well-developed and well-groomed. He is obese. He is not ill-appearing, toxic-appearing or diaphoretic.  HENT:     Head: Normocephalic and atraumatic.     Jaw: There is normal jaw occlusion.     Right Ear: Hearing, tympanic membrane, ear canal and external ear normal.     Left Ear: Hearing, tympanic membrane, ear canal and external ear normal.     Nose: Nose normal.     Mouth/Throat:     Lips: Pink.     Mouth: Mucous membranes are moist.     Pharynx: Oropharynx is clear. Uvula midline.  Eyes:     General: Lids are normal.     Extraocular Movements: Extraocular movements intact.     Conjunctiva/sclera: Conjunctivae normal.     Pupils: Pupils are equal, round, and reactive to light.  Neck:     Thyroid: No thyroid mass, thyromegaly or thyroid tenderness.     Vascular: No carotid bruit or JVD.     Trachea: Trachea and phonation normal.  Cardiovascular:     Rate and Rhythm: Normal rate and regular rhythm.     Chest Wall: PMI is not displaced.     Pulses: Normal pulses.     Heart sounds: Normal heart sounds. No murmur heard.    No friction rub. No gallop.  Pulmonary:     Effort: Pulmonary effort is normal. No respiratory distress.     Breath sounds: Normal breath sounds. No wheezing.  Abdominal:     General: Bowel sounds are normal. There is no distension or abdominal bruit.     Palpations: Abdomen is soft. There is no hepatomegaly or splenomegaly.     Tenderness: There is no abdominal tenderness. There is no right CVA tenderness or left CVA tenderness.     Hernia: No hernia is present.  Musculoskeletal:        General: Normal range of motion.     Cervical back: Normal range of motion and neck supple.     Right lower leg: No edema.      Left lower leg: No edema.  Lymphadenopathy:     Cervical: No cervical adenopathy.  Skin:    General: Skin is warm and dry.     Capillary Refill: Capillary refill takes less than 2 seconds.     Coloration: Skin is not cyanotic, jaundiced or pale.     Findings: No rash.  Neurological:     General: No focal deficit present.     Mental Status: He is alert and oriented to person, place, and time.     Sensory: Sensation is intact.     Motor: Motor function is intact.     Coordination: Coordination is intact.     Gait: Gait is intact.     Deep Tendon Reflexes: Reflexes are normal  and symmetric.  Psychiatric:        Attention and Perception: Attention and perception normal.        Mood and Affect: Mood and affect normal.        Speech: Speech normal.        Behavior: Behavior normal. Behavior is cooperative.        Thought Content: Thought content normal.        Cognition and Memory: Cognition and memory normal.        Judgment: Judgment normal.       Last CBC Lab Results  Component Value Date   WBC 7.6 09/11/2023   HGB 14.0 09/11/2023   HCT 41.9 09/11/2023   MCV 94 09/11/2023   MCH 31.5 09/11/2023   RDW 13.1 09/11/2023   PLT 207 09/11/2023   Last metabolic panel Lab Results  Component Value Date   GLUCOSE 152 (H) 02/27/2024   NA 141 02/27/2024   K 5.0 02/27/2024   CL 104 02/27/2024   CO2 22 02/27/2024   BUN 19 02/27/2024   CREATININE 1.20 02/27/2024   EGFR 65 02/27/2024   CALCIUM 9.5 02/27/2024   PROT 7.3 02/27/2024   ALBUMIN 4.4 02/27/2024   LABGLOB 2.9 02/27/2024   AGRATIO 1.8 03/06/2023   BILITOT 0.7 02/27/2024   ALKPHOS 66 02/27/2024   AST 25 02/27/2024   ALT 26 02/27/2024   Last lipids Lab Results  Component Value Date   CHOL 111 02/27/2024   HDL 40 02/27/2024   LDLCALC 43 02/27/2024   TRIG 171 (H) 02/27/2024   CHOLHDL 2.8 02/27/2024   Last hemoglobin A1c Lab Results  Component Value Date   HGBA1C 6.6 03/03/2024   Last thyroid  functions Lab Results  Component Value Date   TSH 2.160 09/11/2023   T4TOTAL 6.4 09/11/2023   Last vitamin D Lab Results  Component Value Date   VD25OH 30.2 02/27/2024   Last vitamin B12 and Folate Lab Results  Component Value Date   VITAMINB12 370 02/27/2024        Assessment & Plan:    Routine Health Maintenance and Physical Exam  Immunization History  Administered Date(s) Administered   Fluad Quad(high Dose 65+) 09/29/2020   Moderna Sars-Covid-2 Vaccination 02/19/2020, 03/19/2020, 01/18/2021   Tdap 10/07/2018    Health Maintenance  Topic Date Due   INFLUENZA VACCINE  03/24/2024 (Originally 07/26/2023)   COVID-19 Vaccine (4 - 2024-25 season) 03/27/2024 (Originally 08/26/2023)   Zoster Vaccines- Shingrix (1 of 2) 06/11/2024 (Originally 02/11/1973)   Pneumonia Vaccine 48+ Years old (1 of 2 - PCV) 03/11/2025 (Originally 02/12/1960)   HEMOGLOBIN A1C  09/03/2024   Diabetic kidney evaluation - Urine ACR  09/10/2024   Colonoscopy  10/12/2024   OPHTHALMOLOGY EXAM  12/31/2024   Diabetic kidney evaluation - eGFR measurement  02/26/2025   Medicare Annual Wellness (AWV)  03/04/2025   DTaP/Tdap/Td (2 - Td or Tdap) 10/07/2028   Hepatitis C Screening  Completed   HPV VACCINES  Aged Out   FOOT EXAM  Discontinued    Discussed health benefits of physical activity, and encouraged him to engage in regular exercise appropriate for his age and condition.  Problem List Items Addressed This Visit       Cardiovascular and Mediastinum   Hypertension associated with diabetes (HCC)   Relevant Medications   simvastatin (ZOCOR) 40 MG tablet   enalapril (VASOTEC) 2.5 MG tablet   Other Relevant Orders   Thyroid Panel With TSH     Endocrine  Diabetes mellitus (HCC)   Relevant Medications   simvastatin (ZOCOR) 40 MG tablet   enalapril (VASOTEC) 2.5 MG tablet   Hyperlipidemia associated with type 2 diabetes mellitus (HCC)   Relevant Medications   simvastatin (ZOCOR) 40 MG tablet    enalapril (VASOTEC) 2.5 MG tablet   CKD stage 2 due to type 2 diabetes mellitus (HCC)   Relevant Medications   simvastatin (ZOCOR) 40 MG tablet   enalapril (VASOTEC) 2.5 MG tablet     Genitourinary   Benign prostatic hyperplasia   Relevant Medications   tamsulosin (FLOMAX) 0.4 MG CAPS capsule   Other Relevant Orders   PSA, total and free   Other Visit Diagnoses       Annual physical exam    -  Primary   Relevant Orders   Thyroid Panel With TSH   PSA, total and free   Urinalysis, Routine w reflex microscopic   Ambulatory referral to Gastroenterology     Dysuria       Relevant Orders   Urinalysis, Routine w reflex microscopic   Urine Culture     Screening for colon cancer       Relevant Orders   Ambulatory referral to Gastroenterology     Assessment and Plan    Nocturia Experiences nocturia, urinating at least twice per night. Reported a transient burning sensation upon urination this morning. Has prostatitis, which may be related to current symptoms. - Obtain urine sample to check for infection or other abnormalities  Hypertension Blood pressure readings are approximately 130/80 mmHg post-work, not significantly elevated. Renal protection is a priority due to diabetes, necessitating an ACE inhibitor or ARB. Enalapril is recommended for its lower side effect profile and will be trialed at bedtime to reduce fatigue. - Prescribe enalapril 2.5 mg at bedtime - Monitor blood pressure and report any intolerable side effects - Consider switching to an ARB like losartan if enalapril is not tolerated  Type 2 Diabetes Mellitus A1c is 6.6%, indicating good glycemic control. Aware of the need for dietary improvements but finds it challenging. - Continue current diabetes management plan - Encourage dietary improvements to further control blood glucose levels  Hyperlipidemia Triglycerides improved from 372 mg/dL to 119 mg/dL. LDL and HDL levels have also improved, indicating effective  management. - Continue current lipid management plan, including Zocor at bedtime  General Health Maintenance Up to date with vision and dental check-ups. Due for a colonoscopy in October and is hesitant about vaccinations due to needle phobia. - Order PSA and thyroid function tests - Refer for colonoscopy in October - Discuss the importance of vaccinations and address needle phobia       Return in 3 months (on 06/11/2024), or if symptoms worsen or fail to improve, for HTN.     Kari Baars, FNP

## 2024-03-11 NOTE — Addendum Note (Signed)
 Addended by: Sonny Masters on: 03/11/2024 02:33 PM   Modules accepted: Orders

## 2024-03-12 ENCOUNTER — Encounter: Payer: Self-pay | Admitting: Family Medicine

## 2024-03-12 LAB — PSA, TOTAL AND FREE
PSA, Free Pct: 37.1 %
PSA, Free: 0.26 ng/mL
Prostate Specific Ag, Serum: 0.7 ng/mL (ref 0.0–4.0)

## 2024-03-12 LAB — THYROID PANEL WITH TSH
Free Thyroxine Index: 1.9 (ref 1.2–4.9)
T3 Uptake Ratio: 27 % (ref 24–39)
T4, Total: 7.1 ug/dL (ref 4.5–12.0)
TSH: 2.71 u[IU]/mL (ref 0.450–4.500)

## 2024-03-15 LAB — URINE CULTURE

## 2024-03-18 ENCOUNTER — Other Ambulatory Visit: Payer: Self-pay | Admitting: Family Medicine

## 2024-03-18 ENCOUNTER — Encounter: Payer: Self-pay | Admitting: Family Medicine

## 2024-03-18 DIAGNOSIS — E1159 Type 2 diabetes mellitus with other circulatory complications: Secondary | ICD-10-CM

## 2024-03-18 DIAGNOSIS — E1122 Type 2 diabetes mellitus with diabetic chronic kidney disease: Secondary | ICD-10-CM

## 2024-03-18 MED ORDER — LOSARTAN POTASSIUM 25 MG PO TABS: 25.0000 mg | ORAL_TABLET | Freq: Every day | ORAL | 0 refills | Status: DC

## 2024-03-18 NOTE — Progress Notes (Signed)
 Unable to tolerate enalapril, will trial losartan.

## 2024-03-19 ENCOUNTER — Encounter (INDEPENDENT_AMBULATORY_CARE_PROVIDER_SITE_OTHER): Payer: Self-pay | Admitting: *Deleted

## 2024-07-17 ENCOUNTER — Ambulatory Visit: Payer: Self-pay | Admitting: Family Medicine

## 2024-07-17 ENCOUNTER — Ambulatory Visit

## 2024-07-17 ENCOUNTER — Encounter: Payer: Self-pay | Admitting: Family Medicine

## 2024-07-17 VITALS — BP 142/91 | HR 70 | Temp 97.7°F | Ht 71.0 in | Wt 236.4 lb

## 2024-07-17 DIAGNOSIS — E1159 Type 2 diabetes mellitus with other circulatory complications: Secondary | ICD-10-CM | POA: Diagnosis not present

## 2024-07-17 DIAGNOSIS — R1013 Epigastric pain: Secondary | ICD-10-CM

## 2024-07-17 DIAGNOSIS — R5383 Other fatigue: Secondary | ICD-10-CM | POA: Diagnosis not present

## 2024-07-17 DIAGNOSIS — I152 Hypertension secondary to endocrine disorders: Secondary | ICD-10-CM

## 2024-07-17 DIAGNOSIS — R142 Eructation: Secondary | ICD-10-CM

## 2024-07-17 DIAGNOSIS — M2550 Pain in unspecified joint: Secondary | ICD-10-CM | POA: Insufficient documentation

## 2024-07-17 DIAGNOSIS — S30860A Insect bite (nonvenomous) of lower back and pelvis, initial encounter: Secondary | ICD-10-CM

## 2024-07-17 DIAGNOSIS — R5381 Other malaise: Secondary | ICD-10-CM | POA: Diagnosis not present

## 2024-07-17 DIAGNOSIS — K219 Gastro-esophageal reflux disease without esophagitis: Secondary | ICD-10-CM

## 2024-07-17 DIAGNOSIS — M7540 Impingement syndrome of unspecified shoulder: Secondary | ICD-10-CM | POA: Insufficient documentation

## 2024-07-17 DIAGNOSIS — M1A9XX0 Chronic gout, unspecified, without tophus (tophi): Secondary | ICD-10-CM | POA: Insufficient documentation

## 2024-07-17 DIAGNOSIS — W57XXXA Bitten or stung by nonvenomous insect and other nonvenomous arthropods, initial encounter: Secondary | ICD-10-CM

## 2024-07-17 MED ORDER — OLMESARTAN MEDOXOMIL 5 MG PO TABS: 5.0000 mg | ORAL_TABLET | Freq: Every day | ORAL | 6 refills | Status: DC

## 2024-07-17 MED ORDER — PANTOPRAZOLE SODIUM 40 MG PO TBEC: 40.0000 mg | DELAYED_RELEASE_TABLET | Freq: Every day | ORAL | 3 refills | Status: DC

## 2024-07-17 NOTE — Progress Notes (Signed)
 Subjective:  Patient ID: Andres Robinson, male    DOB: 18-Jul-1956, 68 y.o.   MRN: [id number]  Patient Care Team: Severa Rock HERO, FNP as PCP - General (Family Medicine) Lenis Ethelle LELON, MD as Consulting Physician (Endocrinology)   Chief Complaint:  Abdominal Pain (Patient states on Monday he had tightness in his ABD but does not have it any more ) and burping (X 3 days )   HPI: Andres Robinson is a 68 y.o. male presenting on 07/17/2024 for Abdominal Pain (Patient states on Monday he had tightness in his ABD but does not have it any more ) and burping (X 3 days )   Andres Robinson is a 69 year old male with hypertension, diabetes, and high cholesterol who presents with epigastric pain and gastrointestinal symptoms.  He has been experiencing tightness and bloating in the mid-abdominal region since Monday, accompanied by gas, burping, and dull headaches. The burping is described as 'a combination of a hiccup and a burp' and is constant. He also experiences what feels like acid reflux when lying down. Tums have provided slight relief.  On the day symptoms began, he consumed a peanut butter and jelly sandwich with a glass of milk for lunch and later worked on his car, which caused some stress. He denies recent consumption of carbonated beverages but mentions drinking milk, coffee, water , and occasionally a vodka tonic. No recent beer intake.  He reports regular bowel movements without blood or dark tarry stools, although he noticed a dark green color last week. No nausea, vomiting, or sweatiness, except for a cold sweat experienced while working in the yard. He also reports some shortness of breath and increased fatigue, though not more than usual.  His past medical history includes hypertension, diabetes, and high cholesterol. He stopped taking lisinopril  in April due to adverse effects and has not been taking losartan  or enalapril . He continues to take simvastatin  and metformin . His legs  felt heavy when taking blood pressure medications, but this resolved after discontinuation.  He mentions a history of [facility] spotted fever and removing ticks from his body earlier this week. He has not had recent labs for tick-borne illnesses and reports joint aches and cold sweats, which he associates with past tick-borne illnesses. He has not been tested for alpha-gal syndrome.          Relevant past medical, surgical, family, and social history reviewed and updated as indicated.  Allergies and medications reviewed and updated. Data reviewed: Chart in Epic.   Past Medical History:  Diagnosis Date   DDD (degenerative disc disease), cervical    DDD (degenerative disc disease), lumbar    Essential hypertension, benign 08/31/2022   High cholesterol    Normal cardiac stress test 2005   [facility] spotted fever) 2013   Type 2 diabetes mellitus with stage 2 chronic kidney disease, without long-term current use of insulin (HCC) 09/16/2019    Past Surgical History:  Procedure Laterality Date   CATARACT EXTRACTION Bilateral    COLONOSCOPY     COLONOSCOPY WITH PROPOFOL  N/A 10/12/2021   Procedure: COLONOSCOPY WITH PROPOFOL ;  Surgeon: Eartha Angelia Sieving, MD;  Location: [facility];  Service: Gastroenterology;  Laterality: N/A;  10:45   ESOPHAGOGASTRODUODENOSCOPY N/A 01/14/2014   Procedure: ESOPHAGOGASTRODUODENOSCOPY (EGD);  Surgeon: Claudis RAYMOND Rivet, MD;  Location: [facility];  Service: Endoscopy;  Laterality: N/A;  320   POLYPECTOMY  10/12/2021   Procedure: POLYPECTOMY INTESTINAL;  Surgeon: Eartha Angelia,  Toribio, MD;  Location: [facility];  Service: Gastroenterology;;   VASECTOMY      Social History   Socioeconomic History   Marital status: Married    Spouse name: Tena    Number of children: 0   Years of education: Not on file   Highest education level: Some college, no degree  Occupational History   Not on file  Tobacco Use   Smoking status:  Former    Current packs/day: 0.00    Types: Cigarettes    Quit date: 1982    Years since quitting: 43.5   Smokeless tobacco: Never   Tobacco comments:    quit in 1982  Vaping Use   Vaping status: Never Used  Substance and Sexual Activity   Alcohol use: Not Currently    Alcohol/week: 7.0 standard drinks of alcohol    Types: 7 Standard drinks or equivalent per week   Drug use: No   Sexual activity: Not Currently    Partners: Female  Other Topics Concern   Not on file  Social History Narrative   Lives with Tena married since 1981      Pets: 1 boston terrier -deaf and one eye: Winton Jacob      Enjoys: hunt and being outside      Diet: eat all food groups   Caffeine: 4 cups of coffee   Water : 4 glasses a day      Wears seat belt    Smoke detectors    Does not use phone while driving   Social Drivers of Corporate investment banker Strain: Low Risk  (03/04/2024)   Overall Financial Resource Strain (CARDIA)    Difficulty of Paying Living Expenses: Not hard at all  Food Insecurity: No Food Insecurity (03/04/2024)   Hunger Vital Sign    Worried About Running Out of Food in the Last Year: Never true    Ran Out of Food in the Last Year: Never true  Transportation Needs: No Transportation Needs (03/04/2024)   PRAPARE - Administrator, Civil Service (Medical): No    Lack of Transportation (Non-Medical): No  Physical Activity: Insufficiently Active (03/04/2024)   Exercise Vital Sign    Days of Exercise per Week: 3 days    Minutes of Exercise per Session: 30 min  Stress: No Stress Concern Present (03/04/2024)   [employer] of Occupational Health - Occupational Stress Questionnaire    Feeling of Stress : Not at all  Social Connections: Moderately Integrated (03/04/2024)   Social Connection and Isolation Panel    Frequency of Communication with Friends and Family: More than three times a week    Frequency of Social Gatherings with Friends and Family: Three times a week     Attends Religious Services: More than 4 times per year    Active Member of Clubs or Organizations: No    Attends Banker Meetings: Never    Marital Status: Married  Catering manager Violence: Not At Risk (03/04/2024)   Humiliation, Afraid, Rape, and Kick questionnaire    Fear of Current or Ex-Partner: No    Emotionally Abused: No    Physically Abused: No    Sexually Abused: No    Outpatient Encounter Medications as of 07/17/2024  Medication Sig   acetaminophen (TYLENOL) 650 MG CR tablet Take 1,300 mg by mouth every 8 (eight) hours as needed for pain.   diphenhydramine-acetaminophen (TYLENOL PM) 25-500 MG TABS tablet Take 1 tablet by mouth at bedtime as needed (  sleep).   GLUCOSAMINE-CHONDROITIN PO Take 2 tablets by mouth daily as needed.   metFORMIN  (GLUCOPHAGE ) 500 MG tablet TAKE ONE TABLET ONCE DAILY WITH BREAKFAST   olmesartan  (BENICAR ) 5 MG tablet Take 1 tablet (5 mg total) by mouth daily.   Omega-3 Fatty Acids (FISH OIL ULTRA) 1400 MG CAPS Take 1,400 mg by mouth 2 (two) times daily with a meal.   ONETOUCH ULTRA test strip CHECK BLOOD SUGAR ONCE ANDAY   oxymetazoline (AFRIN) 0.05 % nasal spray Place 1 spray into both nostrils 2 (two) times daily as needed for congestion.   pantoprazole  (PROTONIX ) 40 MG tablet Take 1 tablet (40 mg total) by mouth daily.   simvastatin  (ZOCOR ) 40 MG tablet Take 1 tablet (40 mg total) by mouth daily at 6 PM.   tamsulosin  (FLOMAX ) 0.4 MG CAPS capsule Take 1 capsule (0.4 mg total) by mouth 2 (two) times daily.   [DISCONTINUED] losartan  (COZAAR ) 25 MG tablet Take 1 tablet (25 mg total) by mouth daily.   No facility-administered encounter medications on file as of 07/17/2024.    Allergies  Allergen Reactions   Penicillin G Other (See Comments)   Penicillins Other (See Comments)    Childhood Reaction     Pertinent ROS per HPI, otherwise unremarkable      Objective:  BP (!) 142/91   Pulse 70   Temp 97.7 F (36.5 C)   Ht 5' 11  (1.803 m)   Wt 236 lb 6.4 oz (107.2 kg)   SpO2 96%   BMI 32.97 kg/m    Wt Readings from Last 3 Encounters:  07/17/24 236 lb 6.4 oz (107.2 kg)  03/11/24 235 lb 3.2 oz (106.7 kg)  03/04/24 233 lb (105.7 kg)    Physical Exam Vitals and nursing note reviewed.  Constitutional:      General: He is not in acute distress.    Appearance: Normal appearance. He is well-developed and well-groomed. He is obese. He is not ill-appearing, toxic-appearing or diaphoretic.  HENT:     Head: Normocephalic and atraumatic.     Jaw: There is normal jaw occlusion.     Right Ear: Hearing normal.     Left Ear: Hearing normal.     Nose: Nose normal.     Mouth/Throat:     Lips: Pink.     Mouth: Mucous membranes are moist.     Pharynx: Oropharynx is clear. Uvula midline.  Eyes:     General: Lids are normal.     Extraocular Movements: Extraocular movements intact.     Conjunctiva/sclera: Conjunctivae normal.     Pupils: Pupils are equal, round, and reactive to light.  Neck:     Thyroid : No thyroid  mass, thyromegaly or thyroid  tenderness.     Vascular: No carotid bruit or JVD.     Trachea: Trachea and phonation normal.  Cardiovascular:     Rate and Rhythm: Normal rate and regular rhythm.     Chest Wall: PMI is not displaced.     Pulses: Normal pulses.     Heart sounds: Normal heart sounds. No murmur heard.    No friction rub. No gallop.  Pulmonary:     Effort: Pulmonary effort is normal. No respiratory distress.     Breath sounds: Normal breath sounds. No wheezing.  Abdominal:     General: Bowel sounds are normal. There is no distension or abdominal bruit.     Palpations: Abdomen is soft. There is no hepatomegaly or splenomegaly.     Tenderness: There is  no abdominal tenderness. There is no right CVA tenderness or left CVA tenderness.     Hernia: No hernia is present.  Musculoskeletal:        General: Normal range of motion.     Cervical back: Normal range of motion and neck supple.     Right  lower leg: No edema.     Left lower leg: No edema.  Lymphadenopathy:     Cervical: No cervical adenopathy.  Skin:    General: Skin is warm and dry.     Capillary Refill: Capillary refill takes less than 2 seconds.     Coloration: Skin is not cyanotic, jaundiced or pale.     Findings: No lesion or rash.  Neurological:     General: No focal deficit present.     Mental Status: He is alert and oriented to person, place, and time.     Sensory: Sensation is intact.     Motor: Motor function is intact.     Coordination: Coordination is intact.     Gait: Gait is intact.     Deep Tendon Reflexes: Reflexes are normal and symmetric.  Psychiatric:        Attention and Perception: Attention and perception normal.        Mood and Affect: Mood and affect normal.        Speech: Speech normal.        Behavior: Behavior normal. Behavior is cooperative.        Thought Content: Thought content normal.        Cognition and Memory: Cognition and memory normal.        Judgment: Judgment normal.        Results for orders placed or performed in visit on 03/11/24  Urine Culture   Collection Time: 03/11/24  2:17 PM   Specimen: Urine   UR  Result Value Ref Range   Urine Culture, Routine Final report (A)    Organism ID, Bacteria Escherichia coli (A)    Antimicrobial Susceptibility Comment   Microscopic Examination   Collection Time: 03/11/24  2:17 PM   Urine  Result Value Ref Range   WBC, UA 6-10 (A) 0 - 5 /hpf   RBC, Urine None seen 0 - 2 /hpf   Epithelial Cells (non renal) 0-10 0 - 10 /hpf   Renal Epithel, UA None seen None seen /hpf   Bacteria, UA Few (A) None seen/Few   Yeast, UA None seen None seen  Thyroid  Panel With TSH   Collection Time: 03/11/24  2:17 PM  Result Value Ref Range   TSH 2.710 0.450 - 4.500 uIU/mL   T4, Total 7.1 4.5 - 12.0 ug/dL   T3 Uptake Ratio 27 24 - 39 %   Free Thyroxine Index 1.9 1.2 - 4.9  PSA, total and free   Collection Time: 03/11/24  2:17 PM  Result  Value Ref Range   Prostate Specific Ag, Serum 0.7 0.0 - 4.0 ng/mL   PSA, Free 0.26 N/A ng/mL   PSA, Free Pct 37.1 %  Urinalysis, Routine w reflex microscopic   Collection Time: 03/11/24  2:17 PM  Result Value Ref Range   Specific Gravity, UA 1.025 1.005 - 1.030   pH, UA 5.5 5.0 - 7.5   Color, UA Yellow Yellow   Appearance Ur Clear Clear   Leukocytes,UA 1+ (A) Negative   Protein,UA Negative Negative/Trace   Glucose, UA Negative Negative   Ketones, UA Negative Negative   RBC, UA Negative Negative  Bilirubin, UA Negative Negative   Urobilinogen, Ur 0.2 0.2 - 1.0 mg/dL   Nitrite, UA Negative Negative   Microscopic Examination See below:      EKG: SR 65, PR 200 ms, QT 396 ms, anterior fascicular block, no acute ST-T changes, no ectopy. No significant changes from prior EKG. Rosaline Bruns, FNP-C  Pertinent labs & imaging results that were available during my care of the patient were reviewed by me and considered in my medical decision making.  Assessment & Plan:  Macallister was seen today for abdominal pain and burping.  Diagnoses and all orders for this visit:  Epigastric pain -     EKG 12-Lead -     CBC with Differential/Platelet -     CMP14+EGFR -     Thyroid  Panel With TSH -     Spotted Fever Group Antibodies -     Alpha-Gal Panel -     Lyme Disease Serology w/Reflex -     CK isoenzymes (brain, muscle injury) -     Troponin T -     Ambulatory referral to Cardiology  Belching -     EKG 12-Lead -     CBC with Differential/Platelet -     CMP14+EGFR -     Thyroid  Panel With TSH -     Spotted Fever Group Antibodies -     Alpha-Gal Panel -     Lyme Disease Serology w/Reflex -     CK isoenzymes (brain, muscle injury) -     Troponin T  Malaise and fatigue -     EKG 12-Lead -     CBC with Differential/Platelet -     CMP14+EGFR -     Thyroid  Panel With TSH -     Spotted Fever Group Antibodies -     Alpha-Gal Panel -     Lyme Disease Serology w/Reflex -     CK isoenzymes  (brain, muscle injury) -     Troponin T -     Ambulatory referral to Cardiology  Hypertension associated with diabetes (HCC) -     olmesartan  (BENICAR ) 5 MG tablet; Take 1 tablet (5 mg total) by mouth daily. -     EKG 12-Lead -     CBC with Differential/Platelet -     CMP14+EGFR -     Thyroid  Panel With TSH -     CK isoenzymes (brain, muscle injury) -     Troponin T -     Ambulatory referral to Cardiology  Gastroesophageal reflux disease without esophagitis -     pantoprazole  (PROTONIX ) 40 MG tablet; Take 1 tablet (40 mg total) by mouth daily. -     CBC with Differential/Platelet -     Lyme Disease Serology w/Reflex -     Troponin T  Tick bite of lower back, initial encounter -     CBC with Differential/Platelet -     CMP14+EGFR -     Spotted Fever Group Antibodies -     Alpha-Gal Panel -     Lyme Disease Serology w/Reflex -     CK isoenzymes (brain, muscle injury)      Epigastric Pain He experiences epigastric pain, belching, and symptoms suggestive of gastroesophageal reflux disease (GERD) since Monday. Differential diagnosis includes GERD and potential cardiac issues due to overlapping symptoms. His history of stress and dietary factors may contribute to reflux symptoms. Given his hypertension, diabetes, and hyperlipidemia, ruling out cardiac causes is crucial. - Order EKG to rule out  cardiac issues - Prescribe Protonix  for reflux, to be taken once daily for six weeks, then taper off - Advise continuation of Tums or Rolaids as needed - If symptoms return after tapering, restart Protonix  - Order referral to cardiologist for further evaluation  Hypertension He has hypertension and has not been taking prescribed antihypertensive medications due to side effects. He has tried lisinopril , losartan , and enalapril . Controlling blood pressure is essential to prevent complications such as heart attack, stroke, and abdominal aortic aneurysm rupture. - Prescribe low dose omlesartan    Hyperlipidemia He is taking simvastatin  for hyperlipidemia. - Continue simvastatin   Diabetes Mellitus He is taking metformin  for diabetes. - Continue metformin   Tick-borne Illness He reports a history of tick bites and symptoms such as dull headaches and cold sweats, which may be related to tick-borne illnesses. The possibility of alpha-gal syndrome and [facility] spotted fever is considered. - Order labs to check for tick-borne illnesses, including alpha-gal syndrome  Follow-up Follow-up is based on the results of the EKG and lab tests. He will be referred to a cardiologist for further evaluation due to risk factors. The need for annual cardiology evaluation and potential further studies is discussed. - Perform EKG and review results - Order blood work for tick-borne illnesses and cardiac markers - Refer to cardiologist for annual evaluation and potential further studies        Continue all other maintenance medications.  Follow up plan: Return if symptoms worsen or fail to improve.   Continue healthy lifestyle choices, including diet (rich in fruits, vegetables, and lean proteins, and low in salt and simple carbohydrates) and exercise (at least 30 minutes of moderate physical activity daily).  Educational handout given for GERD  The above assessment and management plan was discussed with the patient. The patient verbalized understanding of and has agreed to the management plan. Patient is aware to call the clinic if they develop any new symptoms or if symptoms persist or worsen. Patient is aware when to return to the clinic for a follow-up visit. Patient educated on when it is appropriate to go to the emergency department.   Rosaline Bruns, FNP-C [facility] [phone]

## 2024-07-19 LAB — SPOTTED FEVER GROUP ANTIBODIES
Spotted Fever Group IgG: <1:64 {titer}
Spotted Fever Group IgM: <1:64 {titer}

## 2024-07-21 LAB — CMP14+EGFR
ALT: 26 IU/L (ref 0–44)
AST: 22 IU/L (ref 0–40)
Albumin: 4.6 g/dL (ref 3.9–4.9)
Alkaline Phosphatase: 60 IU/L (ref 44–121)
BUN/Creatinine Ratio: 15 (ref 10–24)
BUN: 18 mg/dL (ref 8–27)
Bilirubin Total: 0.8 mg/dL (ref 0.0–1.2)
CO2: 18 mmol/L — ABNORMAL LOW (ref 20–29)
Calcium: 9.5 mg/dL (ref 8.6–10.2)
Chloride: 101 mmol/L (ref 96–106)
Creatinine, Ser: 1.21 mg/dL (ref 0.76–1.27)
Globulin, Total: 2.8 g/dL (ref 1.5–4.5)
Glucose: 127 mg/dL — ABNORMAL HIGH (ref 70–99)
Potassium: 4.4 mmol/L (ref 3.5–5.2)
Sodium: 137 mmol/L (ref 134–144)
Total Protein: 7.4 g/dL (ref 6.0–8.5)
eGFR: 64 mL/min/1.73 (ref >59)

## 2024-07-21 LAB — CBC WITH DIFFERENTIAL/PLATELET
Basophils Absolute: 0.1 x10E3/uL (ref 0.0–0.2)
Basos: 1 %
EOS (ABSOLUTE): 0.2 x10E3/uL (ref 0.0–0.4)
Eos: 4 %
Hematocrit: 42.8 % (ref 37.5–51.0)
Hemoglobin: 14.4 g/dL (ref 13.0–17.7)
Immature Grans (Abs): 0 x10E3/uL (ref 0.0–0.1)
Immature Granulocytes: 0 %
Lymphocytes Absolute: 0.9 x10E3/uL (ref 0.7–3.1)
Lymphs: 16 %
MCH: 32.7 pg (ref 26.6–33.0)
MCHC: 33.6 g/dL (ref 31.5–35.7)
MCV: 97 fL (ref 79–97)
Monocytes Absolute: 0.4 x10E3/uL (ref 0.1–0.9)
Monocytes: 8 %
Neutrophils Absolute: 3.9 x10E3/uL (ref 1.4–7.0)
Neutrophils: 71 %
Platelets: 207 x10E3/uL (ref 150–450)
RBC: 4.41 x10E6/uL (ref 4.14–5.80)
RDW: 13.2 % (ref 11.6–15.4)
WBC: 5.5 x10E3/uL (ref 3.4–10.8)

## 2024-07-21 LAB — ALPHA-GAL PANEL
Allergen Lamb IgE: 0.56 kU/L — AB
Beef IgE: 1.83 kU/L — AB
IgE (Immunoglobulin E), Serum: 485 [IU]/mL (ref 6–495)
O215-IgE Alpha-Gal: 8.2 kU/L — AB
Pork IgE: 0.66 kU/L — AB

## 2024-07-21 LAB — THYROID PANEL WITH TSH
Free Thyroxine Index: 1.8 (ref 1.2–4.9)
T3 Uptake Ratio: 27 % (ref 24–39)
T4, Total: 6.7 ug/dL (ref 4.5–12.0)
TSH: 2.66 u[IU]/mL (ref 0.450–4.500)

## 2024-07-21 LAB — LYME DISEASE SEROLOGY W/REFLEX: Lyme Total Antibody EIA: NEGATIVE

## 2024-07-21 LAB — CK ISOENZYMES
CK-BB: 0
CK-MB: 0 (ref 0–3)
CK-MM: 100 (ref 97–100)
Macro Type 1: 0
Macro Type 2: 0
Total CK: 168 U/L (ref 41–331)

## 2024-07-21 LAB — TROPONIN T: Troponin T (Highly Sensitive): 9 ng/L (ref 0–22)

## 2024-08-26 ENCOUNTER — Other Ambulatory Visit: Payer: Self-pay | Admitting: "Endocrinology

## 2024-09-03 ENCOUNTER — Encounter: Payer: Self-pay | Admitting: "Endocrinology

## 2024-09-03 ENCOUNTER — Ambulatory Visit: Admitting: "Endocrinology

## 2024-09-03 VITALS — BP 128/82 | HR 72 | Ht 71.0 in | Wt 238.2 lb

## 2024-09-03 DIAGNOSIS — I10 Essential (primary) hypertension: Secondary | ICD-10-CM | POA: Diagnosis not present

## 2024-09-03 DIAGNOSIS — Z7984 Long term (current) use of oral hypoglycemic drugs: Secondary | ICD-10-CM | POA: Diagnosis not present

## 2024-09-03 DIAGNOSIS — E782 Mixed hyperlipidemia: Secondary | ICD-10-CM | POA: Diagnosis not present

## 2024-09-03 DIAGNOSIS — E1122 Type 2 diabetes mellitus with diabetic chronic kidney disease: Secondary | ICD-10-CM | POA: Diagnosis not present

## 2024-09-03 DIAGNOSIS — E1169 Type 2 diabetes mellitus with other specified complication: Secondary | ICD-10-CM

## 2024-09-03 DIAGNOSIS — N182 Chronic kidney disease, stage 2 (mild): Secondary | ICD-10-CM

## 2024-09-03 LAB — POCT GLYCOSYLATED HEMOGLOBIN (HGB A1C): HbA1c, POC (controlled diabetic range): 7.2 % — AB (ref 0.0–7.0)

## 2024-09-03 MED ORDER — METFORMIN HCL 500 MG PO TABS: 500.0000 mg | ORAL_TABLET | Freq: Two times a day (BID) | ORAL | 1 refills | Status: DC

## 2024-09-03 NOTE — Patient Instructions (Signed)

## 2024-09-03 NOTE — Progress Notes (Signed)
 09/03/2024, 1:29 PM  Endocrinology follow-up note   Subjective:    Patient ID: Andres Robinson, male    DOB: Jun 08, 1956.  Andres Robinson is being seen in  Follow up after he was seen in consultation for management of currently uncontrolled symptomatic diabetes requested by  Severa Rock HERO, FNP.   Past Medical History:  Diagnosis Date   DDD (degenerative disc disease), cervical    DDD (degenerative disc disease), lumbar    Essential hypertension, benign 08/31/2022   High cholesterol    Normal cardiac stress test 2005   RMSF [facility] spotted fever) 2013   Type 2 diabetes mellitus with stage 2 chronic kidney disease, without long-term current use of insulin (HCC) 09/16/2019    Past Surgical History:  Procedure Laterality Date   CATARACT EXTRACTION Bilateral    COLONOSCOPY     COLONOSCOPY WITH PROPOFOL  N/A 10/12/2021   Procedure: COLONOSCOPY WITH PROPOFOL ;  Surgeon: Eartha Angelia Sieving, MD;  Location: [facility];  Service: Gastroenterology;  Laterality: N/A;  10:45   ESOPHAGOGASTRODUODENOSCOPY N/A 01/14/2014   Procedure: ESOPHAGOGASTRODUODENOSCOPY (EGD);  Surgeon: Claudis RAYMOND Rivet, MD;  Location: [facility];  Service: Endoscopy;  Laterality: N/A;  320   POLYPECTOMY  10/12/2021   Procedure: POLYPECTOMY INTESTINAL;  Surgeon: Eartha Angelia, Sieving, MD;  Location: [facility];  Service: Gastroenterology;;   VASECTOMY      Social History   Socioeconomic History   Marital status: Married    Spouse name: Tena    Number of children: 0   Years of education: Not on file   Highest education level: Some college, no degree  Occupational History   Not on file  Tobacco Use   Smoking status: Former    Current packs/day: 0.00    Types: Cigarettes    Quit date: 1982    Years since quitting: 43.7   Smokeless tobacco: Never   Tobacco comments:    quit in 1982  Vaping Use   Vaping status: Never Used  Substance and Sexual Activity    Alcohol use: Not Currently    Alcohol/week: 7.0 standard drinks of alcohol    Types: 7 Standard drinks or equivalent per week   Drug use: No   Sexual activity: Not Currently    Partners: Female  Other Topics Concern   Not on file  Social History Narrative   Lives with Tena married since 1981      Pets: 1 boston terrier -deaf and one eye: Winton Jacob      Enjoys: hunt and being outside      Diet: eat all food groups   Caffeine: 4 cups of coffee   Water : 4 glasses a day      Wears seat belt    Smoke detectors    Does not use phone while driving   Social Drivers of Corporate investment banker Strain: Low Risk  (03/04/2024)   Overall Financial Resource Strain (CARDIA)    Difficulty of Paying Living Expenses: Not hard at all  Food Insecurity: No Food Insecurity (03/04/2024)   Hunger Vital Sign    Worried About Running Out of Food in the Last Year: Never true    Ran Out of Food in the Last Year: Never true  Transportation Needs: No Transportation Needs (03/04/2024)   PRAPARE - Administrator, Civil Service (Medical): No    Lack of Transportation (Non-Medical): No  Physical Activity: Insufficiently Active (03/04/2024)  Exercise Vital Sign    Days of Exercise per Week: 3 days    Minutes of Exercise per Session: 30 min  Stress: No Stress Concern Present (03/04/2024)   [employer] of Occupational Health - Occupational Stress Questionnaire    Feeling of Stress : Not at all  Social Connections: Moderately Integrated (03/04/2024)   Social Connection and Isolation Panel    Frequency of Communication with Friends and Family: More than three times a week    Frequency of Social Gatherings with Friends and Family: Three times a week    Attends Religious Services: More than 4 times per year    Active Member of Clubs or Organizations: No    Attends Banker Meetings: Never    Marital Status: Married    Family History  Problem Relation Age of Onset   Early  death Mother    Heart disease Father    Diabetes Father    Hyperlipidemia Father    Hypertension Father    AAA (abdominal aortic aneurysm) Father    Peripheral vascular disease Father        amputation   Heart Problems Father    Hypertension Brother    Heart attack Brother     Outpatient Encounter Medications as of 09/03/2024  Medication Sig   acetaminophen (TYLENOL) 650 MG CR tablet Take 1,300 mg by mouth every 8 (eight) hours as needed for pain.   diphenhydramine-acetaminophen (TYLENOL PM) 25-500 MG TABS tablet Take 1 tablet by mouth at bedtime as needed (sleep).   GLUCOSAMINE-CHONDROITIN PO Take 2 tablets by mouth daily as needed.   metFORMIN  (GLUCOPHAGE ) 500 MG tablet Take 1 tablet (500 mg total) by mouth 2 (two) times daily with a meal.   olmesartan  (BENICAR ) 5 MG tablet Take 1 tablet (5 mg total) by mouth daily.   Omega-3 Fatty Acids (FISH OIL ULTRA) 1400 MG CAPS Take 1,400 mg by mouth 2 (two) times daily with a meal.   ONETOUCH ULTRA test strip CHECK BLOOD SUGAR ONCE ANDAY   oxymetazoline (AFRIN) 0.05 % nasal spray Place 1 spray into both nostrils 2 (two) times daily as needed for congestion.   pantoprazole  (PROTONIX ) 40 MG tablet Take 1 tablet (40 mg total) by mouth daily.   simvastatin  (ZOCOR ) 40 MG tablet Take 1 tablet (40 mg total) by mouth daily at 6 PM.   tamsulosin  (FLOMAX ) 0.4 MG CAPS capsule Take 1 capsule (0.4 mg total) by mouth 2 (two) times daily.   [DISCONTINUED] metFORMIN  (GLUCOPHAGE ) 500 MG tablet TAKE ONE TABLET ONCE DAILY WITH BREAKFAST   No facility-administered encounter medications on file as of 09/03/2024.    ALLERGIES: Allergies  Allergen Reactions   Penicillin G Other (See Comments)   Penicillins Other (See Comments)    Childhood Reaction     VACCINATION STATUS: Immunization History  Administered Date(s) Administered   Fluad Quad(high Dose 65+) 09/29/2020   Moderna Sars-Covid-2 Vaccination 02/19/2020, 03/19/2020, 01/18/2021   Tdap 10/07/2018     Diabetes He presents for his follow-up diabetic visit. He has type 2 diabetes mellitus. Onset time: He was diagnosed at approximate age of 27 years. His disease course has been worsening. There are no hypoglycemic associated symptoms. Pertinent negatives for hypoglycemia include no confusion, headaches, pallor or seizures. There are no diabetic associated symptoms. Pertinent negatives for diabetes include no chest pain, no fatigue, no polydipsia, no polyphagia, no polyuria and no weakness. There are no hypoglycemic complications. Symptoms are worsening. There are no diabetic complications. Risk factors for coronary  artery disease include diabetes mellitus, dyslipidemia, male sex, obesity, tobacco exposure, sedentary lifestyle and family history. Current diabetic treatment includes oral agent (monotherapy). His weight is fluctuating minimally. He is following a diabetic diet. He has not had a previous visit with a dietitian. He rarely participates in exercise. His home blood glucose trend is fluctuating minimally. (Andres Robinson did not bring any logs or meter.  His point-of-care A1c was 7.2% increasing from 6.6%.  ) An ACE inhibitor/angiotensin II receptor blocker is not being taken. Eye exam is current.  Hyperlipidemia This is a chronic problem. The current episode started more than 1 year ago. The problem is uncontrolled. Exacerbating diseases include diabetes and obesity. Pertinent negatives include no chest pain, myalgias or shortness of breath. Current antihyperlipidemic treatment includes statins. Risk factors for coronary artery disease include dyslipidemia, diabetes mellitus, obesity, male sex and a sedentary lifestyle.    Objective:    BP 128/82   Pulse 72   Ht 5' 11 (1.803 m)   Wt 238 lb 3.2 oz (108 kg)   BMI 33.22 kg/m   Wt Readings from Last 3 Encounters:  09/03/24 238 lb 3.2 oz (108 kg)  07/17/24 236 lb 6.4 oz (107.2 kg)  03/11/24 235 lb 3.2 oz (106.7 kg)     CMP     Component  Value Date/Time   NA 137 07/17/2024 1040   K 4.4 07/17/2024 1040   CL 101 07/17/2024 1040   CO2 18 (L) 07/17/2024 1040   GLUCOSE 127 (H) 07/17/2024 1040   GLUCOSE 123 (H) 06/16/2020 0731   BUN 18 07/17/2024 1040   CREATININE 1.21 07/17/2024 1040   CREATININE 1.08 06/16/2020 0731   CALCIUM 9.5 07/17/2024 1040   PROT 7.4 07/17/2024 1040   ALBUMIN 4.6 07/17/2024 1040   AST 22 07/17/2024 1040   ALT 26 07/17/2024 1040   ALKPHOS 60 07/17/2024 1040   BILITOT 0.8 07/17/2024 1040   GFRNONAA 69 12/13/2020 1457   GFRNONAA 66 03/10/2020 0943   GFRAA 80 12/13/2020 1457   GFRAA 76 03/10/2020 0943     Diabetic Labs (most recent): Lab Results  Component Value Date   HGBA1C 7.2 (A) 09/03/2024   HGBA1C 6.6 03/03/2024   HGBA1C 6.4 (H) 09/11/2023     Lipid Panel ( most recent) Lipid Panel     Component Value Date/Time   CHOL 111 02/27/2024 0827   TRIG 171 (H) 02/27/2024 0827   HDL 40 02/27/2024 0827   CHOLHDL 2.8 02/27/2024 0827   CHOLHDL 2.8 03/10/2020 0943   LDLCALC 43 02/27/2024 0827   LDLCALC 55 03/10/2020 0943      Assessment & Plan:   1. Type 2 diabetes mellitus with stage 2 chronic kidney disease, without long-term current use of insulin (HCC)  - Andres Robinson has currently controlled asymptomatic type 2 DM since  68 years of age.  Andres Robinson did not bring any logs or meter.  His point-of-care A1c was 7.2% increasing from 6.6%.     Recent labs reviewed. - I had a long discussion with him about the progressive nature of diabetes and the pathology behind its complications. -He does has mild microalbuminuria, no gross complications , however, he remains at a high risk for more acute and chronic complications which include CAD, CVA, CKD, retinopathy, and neuropathy. These are all discussed in detail with him.  - I have counseled him on diet  and weight management  by adopting a carbohydrate restricted/protein rich diet. Patient is encouraged to switch to  unprocessed or  minimally processed     complex starch and increased protein intake (animal or plant source), fruits, and vegetables. -  he is advised to stick to a routine mealtimes to eat 3 meals  a day and avoid unnecessary snacks ( to snack only to correct hypoglycemia).   - he acknowledges that there is a room for improvement in his food and drink choices. - Suggestion is made for him to avoid simple carbohydrates  from his diet including Cakes, Sweet Desserts, Ice Cream, Soda (diet and regular), Sweet Tea, Candies, Chips, Cookies, Store Bought Juices, Alcohol , Artificial Sweeteners,  Coffee Creamer, and Sugar-free Products, Lemonade. This will help patient to have more stable blood glucose profile and potentially avoid unintended weight gain.  The following Lifestyle Medicine recommendations according to American College of Lifestyle Medicine  [facility]) were discussed and and offered to patient and he  agrees to start the journey:  A. Whole Foods, Plant-Based Nutrition comprising of fruits and vegetables, plant-based proteins, whole-grain carbohydrates was discussed in detail with the patient.   A list for source of those nutrients were also provided to the patient.  Patient will use only water  or unsweetened tea for hydration. B.  The need to stay away from risky substances including alcohol, smoking; obtaining 7 to 9 hours of restorative sleep, at least 150 minutes of moderate intensity exercise weekly, the importance of healthy social connections,  and stress management techniques were discussed. C.  A full color page of  Calorie density of various food groups per pound showing examples of each food groups was provided to the patient.   - I have approached him with the following individualized plan to manage  his diabetes and patient agrees:   - Based on his presentation with above target glycemic profile with point-of-care A1c of 7.2%, he was approached for more options with GLP-1 receptor agonist SGLT2  inhibitors.  However he opted to delay this intervention and promises to work on his lifestyle to control his diabetes.   - He accepts to increase metformin  to 500 mg p.o. twice daily-with breakfast and with supper daily.    - Specific targets for  A1c;  LDL, HDL, Triglycerides,  were discussed with the patient.  2) Blood Pressure /Hypertension:  -His blood pressure is controlled to target.  He is advised to continue Benicar  5 mg daily.   3) Lipids/Hyperlipidemia:   Review of his recent lipid panel showed controlled LDL at 43.  He is advised to continue Zocor  40 mg p.o. nightly.    Side effects and precautions discussed with him.      4)  Weight/Diet:  Body mass index is 33.22 kg/m.  -   clearly complicating his diabetes care.   he is  a candidate for weight loss. I discussed with him the fact that loss of 5 - 10% of his  current body weight will have the most impact on his diabetes management.  Exercise, and detailed carbohydrates information provided  -  detailed on discharge instructions.  5) Chronic Care/Health Maintenance:  -he  is on Statin medications and  is encouraged to initiate and continue to follow up with Ophthalmology, Dentist,  Podiatrist at least yearly or according to recommendations, and advised to   stay away from smoking. I have recommended yearly flu vaccine and pneumonia vaccine at least every 5 years; moderate intensity exercise for up to 150 minutes weekly; and  sleep for at least 7 hours a day.  His  screening ABI was negative for PAD in September 2021, his study will be repeated in December 2026, or sooner if needed.   - he is  advised to maintain close follow up with Rakes, Rock HERO, FNP for primary care needs, as well as his other providers for optimal and coordinated care.   I spent  25  minutes in the care of the patient today including review of labs from CMP, Lipids, Thyroid  Function, Hematology (current and previous including abstractions from other  facilities); face-to-face time discussing  his blood glucose readings/logs, discussing hypoglycemia and hyperglycemia episodes and symptoms, medications doses, his options of short and long term treatment based on the latest standards of care / guidelines;  discussion about incorporating lifestyle medicine;  and documenting the encounter. Risk reduction counseling performed per USPSTF guidelines to reduce  obesity and cardiovascular risk factors.     Please refer to Patient Instructions for Blood Glucose Monitoring and Insulin/Medications Dosing Guide  in media tab for additional information. Please  also refer to  Patient Self Inventory in the Media  tab for reviewed elements of pertinent patient history.  Andres Robinson participated in the discussions, expressed understanding, and voiced agreement with the above plans.  All questions were answered to his satisfaction. he is encouraged to contact clinic should he have any questions or concerns prior to his return visit.  Follow up plan: - Return in about 6 months (around 03/03/2025) for F/U with Pre-visit Labs.  Ranny Earl, MD [facility] [address] [place], [place] [address] Phone: [phone]  Fax: [phone]    09/03/2024, 1:29 PM  This note was partially dictated with voice recognition software. Similar sounding words can be transcribed inadequately or may not  be corrected upon review.

## 2024-09-05 ENCOUNTER — Encounter (INDEPENDENT_AMBULATORY_CARE_PROVIDER_SITE_OTHER): Payer: Self-pay | Admitting: *Deleted

## 2024-09-22 ENCOUNTER — Encounter (INDEPENDENT_AMBULATORY_CARE_PROVIDER_SITE_OTHER): Payer: Self-pay | Admitting: *Deleted

## 2024-10-01 ENCOUNTER — Telehealth (INDEPENDENT_AMBULATORY_CARE_PROVIDER_SITE_OTHER): Payer: Self-pay

## 2024-10-01 NOTE — Telephone Encounter (Signed)
 Any room Thanks

## 2024-10-01 NOTE — Telephone Encounter (Signed)
 Who is your primary care physician: Rosaline Bruns  Reasons for the colonoscopy: screening  Have you had a colonoscopy before?  yes  Do you have family history of colon cancer? no  Previous colonoscopy with polyps removed? no  Do you have a history colorectal cancer?   no  Are you diabetic? If yes, Type 1 or Type 2?    Pre-diabetic  Do you have a prosthetic or mechanical heart valve? no  Do you have a pacemaker/defibrillator?   no  Have you had endocarditis/atrial fibrillation? no  Have you had joint replacement within the last 12 months?  no  Do you tend to be constipated or have to use laxatives? no  Do you have any history of drugs or alchohol?  no  Do you use supplemental oxygen?  no  Have you had a stroke or heart attack within the last 6 months? no  Do you take weight loss medication?  no  Do you take any blood-thinning medications such as: (aspirin, warfarin, Plavix, Aggrenox)  no  If yes we need the name, milligram, dosage and who is prescribing doctor  Current Outpatient Medications on File Prior to Visit  Medication Sig Dispense Refill   metFORMIN  (GLUCOPHAGE ) 500 MG tablet Take 1 tablet (500 mg total) by mouth 2 (two) times daily with a meal. 180 tablet 1   olmesartan  (BENICAR ) 5 MG tablet Take 1 tablet (5 mg total) by mouth daily. 30 tablet 6   ONETOUCH ULTRA test strip CHECK BLOOD SUGAR ONCE ANDAY 100 strip 2   pantoprazole  (PROTONIX ) 40 MG tablet Take 1 tablet (40 mg total) by mouth daily. 30 tablet 3   simvastatin  (ZOCOR ) 40 MG tablet Take 1 tablet (40 mg total) by mouth daily at 6 PM. 90 tablet 3   tamsulosin  (FLOMAX ) 0.4 MG CAPS capsule Take 1 capsule (0.4 mg total) by mouth 2 (two) times daily. 180 capsule 3   No current facility-administered medications on file prior to visit.    Allergies  Allergen Reactions   Penicillin G Other (See Comments)   Penicillins Other (See Comments)    Childhood Reaction      Pharmacy: [facility]  Primary  Insurance Name: Medicare  Best number where you can be reached: [phone]

## 2024-10-02 MED ORDER — PEG 3350-KCL-NA BICARB-NACL 420 G PO SOLR: 4000.0000 mL | Freq: Once | ORAL | 0 refills | Status: AC

## 2024-10-02 NOTE — Addendum Note (Signed)
 Addended by: JEANELL GRAEME RAMAN on: 10/02/2024 09:11 AM   Modules accepted: Orders

## 2024-10-02 NOTE — Telephone Encounter (Signed)
 Spoke with pt. He has been scheduled for 10/21. Aware will send instructions to him and rx for prep sent to pharmacy. Aware will get a pre-op phone call with arrival time.

## 2024-10-02 NOTE — Telephone Encounter (Signed)
 Questionnaire from recall, no referral needed

## 2024-10-06 NOTE — Telephone Encounter (Signed)
 Spoke with patient, he stated that he would like to reschedule his colonoscopy to December. I told him we would give him a call when we get our December schedule. Message sent to Endo.

## 2024-10-06 NOTE — Telephone Encounter (Signed)
 Patient left vm wanting to reschedule his procedure. Returned patients call, no answer. LVM for call back.

## 2024-10-06 NOTE — Telephone Encounter (Signed)
 LMOVM to return call  Pt left vm needing to reschedule his procedure.

## 2024-10-08 ENCOUNTER — Encounter (INDEPENDENT_AMBULATORY_CARE_PROVIDER_SITE_OTHER): Payer: Self-pay | Admitting: Gastroenterology

## 2024-10-09 ENCOUNTER — Encounter (HOSPITAL_COMMUNITY): Admission: RE | Admit: 2024-10-09 | Source: Ambulatory Visit

## 2024-10-13 ENCOUNTER — Other Ambulatory Visit: Payer: Self-pay

## 2024-10-13 DIAGNOSIS — E1169 Type 2 diabetes mellitus with other specified complication: Secondary | ICD-10-CM

## 2024-10-13 MED ORDER — METFORMIN HCL 500 MG PO TABS: 500.0000 mg | ORAL_TABLET | Freq: Two times a day (BID) | ORAL | 1 refills | Status: AC

## 2024-10-14 ENCOUNTER — Encounter (HOSPITAL_COMMUNITY): Admission: RE | Payer: Self-pay | Source: Home / Self Care

## 2024-10-14 ENCOUNTER — Ambulatory Visit (HOSPITAL_COMMUNITY): Admission: RE | Admit: 2024-10-14 | Source: Home / Self Care | Admitting: Gastroenterology

## 2024-10-14 SURGERY — COLONOSCOPY
Anesthesia: Choice

## 2024-10-27 ENCOUNTER — Encounter: Payer: Self-pay | Admitting: Internal Medicine

## 2024-10-27 ENCOUNTER — Ambulatory Visit: Attending: Internal Medicine | Admitting: Internal Medicine

## 2024-10-27 VITALS — BP 128/78 | HR 76 | Ht 71.0 in | Wt 239.8 lb

## 2024-10-27 DIAGNOSIS — Z8249 Family history of ischemic heart disease and other diseases of the circulatory system: Secondary | ICD-10-CM | POA: Insufficient documentation

## 2024-10-27 DIAGNOSIS — R109 Unspecified abdominal pain: Secondary | ICD-10-CM | POA: Diagnosis not present

## 2024-10-27 DIAGNOSIS — Z87891 Personal history of nicotine dependence: Secondary | ICD-10-CM

## 2024-10-27 DIAGNOSIS — Z136 Encounter for screening for cardiovascular disorders: Secondary | ICD-10-CM

## 2024-10-27 NOTE — Progress Notes (Signed)
 Cardiology Office Note  Date: 10/27/2024   ID: Martez, Weiand [birth year], MRN [id number]  PCP:  Severa Rock HERO, FNP  Cardiologist:  Diannah SHAUNNA Maywood, MD Electrophysiologist:  None   History of Present Illness: Andres Robinson is a 68 y.o. male  Prior records reviewed.  Current risk factors include HTN, DM 2.  Father has history of AAA.  He underwent AAA screening in 2016, that was within normal limits.  Patient reported having pain around his abdomen back in July 2025 with no recurrences.  He was eating a lot of tomatoes at that time.  He thinks that could be from acid reflux.  No angina.  No chest pain.  [place]  Physically active at baseline.  METs more than 4.  Does not have other symptoms of angina/DOE.  No dizziness, syncope, palpitations, leg swelling.  No prior MI/CABG/PCI.  Past Medical History:  Diagnosis Date   DDD (degenerative disc disease), cervical    DDD (degenerative disc disease), lumbar    Essential hypertension, benign 08/31/2022   High cholesterol    Normal cardiac stress test 2005   [facility] spotted fever) 2013   Type 2 diabetes mellitus with stage 2 chronic kidney disease, without long-term current use of insulin (HCC) 09/16/2019    Past Surgical History:  Procedure Laterality Date   CATARACT EXTRACTION Bilateral    COLONOSCOPY     COLONOSCOPY WITH PROPOFOL  N/A 10/12/2021   Procedure: COLONOSCOPY WITH PROPOFOL ;  Surgeon: Eartha Angelia Sieving, MD;  Location: [facility];  Service: Gastroenterology;  Laterality: N/A;  10:45   ESOPHAGOGASTRODUODENOSCOPY N/A 01/14/2014   Procedure: ESOPHAGOGASTRODUODENOSCOPY (EGD);  Surgeon: Claudis RAYMOND Rivet, MD;  Location: [facility];  Service: Endoscopy;  Laterality: N/A;  320   POLYPECTOMY  10/12/2021   Procedure: POLYPECTOMY INTESTINAL;  Surgeon: Eartha Angelia, Sieving, MD;  Location: [facility];  Service: Gastroenterology;;   VASECTOMY      Current Outpatient Medications   Medication Sig Dispense Refill   metFORMIN  (GLUCOPHAGE ) 500 MG tablet Take 1 tablet (500 mg total) by mouth 2 (two) times daily with a meal. 180 tablet 1   olmesartan  (BENICAR ) 5 MG tablet Take 1 tablet (5 mg total) by mouth daily. 30 tablet 6   pantoprazole  (PROTONIX ) 40 MG tablet Take 1 tablet (40 mg total) by mouth daily. 30 tablet 3   simvastatin  (ZOCOR ) 40 MG tablet Take 1 tablet (40 mg total) by mouth daily at 6 PM. 90 tablet 3   tamsulosin  (FLOMAX ) 0.4 MG CAPS capsule Take 1 capsule (0.4 mg total) by mouth 2 (two) times daily. 180 capsule 3   ONETOUCH ULTRA test strip CHECK BLOOD SUGAR ONCE ANDAY (Patient not taking: Reported on 10/27/2024) 100 strip 2   No current facility-administered medications for this visit.   Allergies:  Penicillin g and Penicillins   Social History: The patient  reports that he quit smoking about 43 years ago. His smoking use included cigarettes. He has never used smokeless tobacco. He reports that he does not currently use alcohol after a past usage of about 7.0 standard drinks of alcohol per week. He reports that he does not use drugs.   Family History: The patient's family history includes AAA (abdominal aortic aneurysm) in his father; Diabetes in his father; Early death in his mother; Heart Problems in his father; Heart attack in his brother; Heart disease in his father; Hyperlipidemia in his father; Hypertension in his brother and father;  Peripheral vascular disease in his father.   ROS:  Please see the history of present illness. Otherwise, complete review of systems is positive for none  All other systems are reviewed and negative.   Physical Exam: VS:  BP 128/78   Pulse 76   Ht 5' 11 (1.803 m)   Wt 239 lb 12.8 oz (108.8 kg)   SpO2 97%   BMI 33.45 kg/m , BMI Body mass index is 33.45 kg/m.  Wt Readings from Last 3 Encounters:  10/27/24 239 lb 12.8 oz (108.8 kg)  09/03/24 238 lb 3.2 oz (108 kg)  07/17/24 236 lb 6.4 oz (107.2 kg)    General:  Patient appears comfortable at rest. HEENT: Conjunctiva and lids normal, oropharynx clear with moist mucosa. Neck: Supple, no elevated JVP or carotid bruits, no thyromegaly. Lungs: Clear to auscultation, nonlabored breathing at rest. Cardiac: Regular rate and rhythm, no S3 or significant systolic murmur, no pericardial rub. Abdomen: Soft, nontender, no hepatomegaly, bowel sounds present, no guarding or rebound. Extremities: No pitting edema, distal pulses 2+. Skin: Warm and dry. Musculoskeletal: No kyphosis. Neuropsychiatric: Alert and oriented x3, affect grossly appropriate.  Recent Labwork: 07/17/2024: ALT 26; AST 22; BUN 18; Creatinine, Ser 1.21; Hemoglobin 14.4; Platelets 207; Potassium 4.4; Sodium 137; TSH 2.660     Component Value Date/Time   CHOL 111 02/27/2024 0827   TRIG 171 (H) 02/27/2024 0827   HDL 40 02/27/2024 0827   CHOLHDL 2.8 02/27/2024 0827   CHOLHDL 2.8 03/10/2020 0943   LDLCALC 43 02/27/2024 0827   LDLCALC 55 03/10/2020 0943    Assessment and Plan:  Pain in the abdomen - Patient had pain around the abdomen back in June/July 2025.  This is not anginal equivalent.  No angina per se.  He was eating a lot of tomatoes then.  Did not have any recurrences of abdominal pain since then. - EKG showed NSR, no ischemia.  Troponin T x 1 within normal limits. - Discussed symptoms of CAD and MI. - Heart healthy diet recommendations provided. - ER precautions for chest pain provided.  Prior records from PCP reviewed.  History of AAA in the father - Patient underwent AAA screening in 2016, within normal limits.  Repeat ultrasound duplex abdomen.  HTN, controlled - Continue olmesartan  5 mg once daily.  Follows with PCP.     30 minutes spent in reviewing prior medical records, more than 3 labs, discussion and documentation.  Medication Adjustments/Labs and Tests Ordered: Current medicines are reviewed at length with the patient today.  Concerns regarding medicines are  outlined above.    Disposition:  Follow up prn  Signed Andres Robinson Arleta Maywood, MD, 10/27/2024 3:25 PM    [facility] [address] [place], [place], [place] [address]

## 2024-10-27 NOTE — Patient Instructions (Addendum)
 Medication Instructions:  Your physician recommends that you continue on your current medications as directed. Please refer to the Current Medication list given to you today.  Labwork: none  Testing/Procedures: Your physician has requested that you have an abdominal aorta duplex. During this test, an ultrasound is used to evaluate the aorta. Allow 30 minutes for this exam. Do not eat after midnight the day before and avoid carbonated beverages.  Please note: We ask at that you not bring children with you during ultrasound (echo/ vascular) testing. Due to room size and safety concerns, children are not allowed in the ultrasound rooms during exams. Our front office staff cannot provide observation of children in our lobby area while testing is being conducted. An adult accompanying a patient to their appointment will only be allowed in the ultrasound room at the discretion of the ultrasound technician under special circumstances. We apologize for any inconvenience.  Follow-Up: Your physician recommends that you schedule a follow-up appointment in: as needed  Any Other Special Instructions Will Be Listed Below (If Applicable).  If you need a refill on your cardiac medications before your next appointment, please call your pharmacy.

## 2024-10-29 ENCOUNTER — Other Ambulatory Visit: Payer: Self-pay

## 2024-10-29 ENCOUNTER — Encounter: Payer: Self-pay | Admitting: Family Medicine

## 2024-10-29 DIAGNOSIS — Z1211 Encounter for screening for malignant neoplasm of colon: Secondary | ICD-10-CM

## 2024-11-05 ENCOUNTER — Ambulatory Visit: Attending: Internal Medicine

## 2024-11-05 DIAGNOSIS — Z136 Encounter for screening for cardiovascular disorders: Secondary | ICD-10-CM

## 2024-11-05 DIAGNOSIS — Z87891 Personal history of nicotine dependence: Secondary | ICD-10-CM

## 2024-11-06 ENCOUNTER — Ambulatory Visit: Payer: Self-pay | Admitting: Internal Medicine

## 2024-11-12 ENCOUNTER — Other Ambulatory Visit: Payer: Self-pay | Admitting: Family Medicine

## 2024-11-12 DIAGNOSIS — K219 Gastro-esophageal reflux disease without esophagitis: Secondary | ICD-10-CM

## 2025-01-10 ENCOUNTER — Other Ambulatory Visit: Payer: Self-pay | Admitting: Family Medicine

## 2025-01-10 DIAGNOSIS — I152 Hypertension secondary to endocrine disorders: Secondary | ICD-10-CM

## 2025-01-10 DIAGNOSIS — E1159 Type 2 diabetes mellitus with other circulatory complications: Secondary | ICD-10-CM

## 2025-03-03 ENCOUNTER — Ambulatory Visit: Admitting: "Endocrinology

## 2025-03-05 ENCOUNTER — Ambulatory Visit: Payer: Self-pay

## 2025-03-10 ENCOUNTER — Ambulatory Visit
